# Patient Record
Sex: Male | Born: 1945 | Race: Black or African American | Hispanic: No | Smoking: Former smoker
Health system: Southern US, Community
[De-identification: ages and names within clinical notes are randomized; demographics above are authoritative.]

## PROBLEM LIST (undated history)

## (undated) DIAGNOSIS — I10 Essential (primary) hypertension: Secondary | ICD-10-CM

## (undated) DIAGNOSIS — C140 Malignant neoplasm of pharynx, unspecified: Secondary | ICD-10-CM

## (undated) DIAGNOSIS — C7800 Secondary malignant neoplasm of unspecified lung: Secondary | ICD-10-CM

## (undated) DIAGNOSIS — E785 Hyperlipidemia, unspecified: Secondary | ICD-10-CM

## (undated) DIAGNOSIS — E119 Type 2 diabetes mellitus without complications: Secondary | ICD-10-CM

## (undated) HISTORY — PX: COLONOSCOPY: SHX174

---

## 2011-08-11 HISTORY — PX: THROAT SURGERY: SHX803

## 2013-10-12 ENCOUNTER — Encounter (HOSPITAL_COMMUNITY): Payer: Self-pay | Admitting: Emergency Medicine

## 2013-10-12 ENCOUNTER — Inpatient Hospital Stay (HOSPITAL_COMMUNITY)
Admission: EM | Admit: 2013-10-12 | Discharge: 2013-10-14 | DRG: 378 | Disposition: A | Discharge status: Home / Self Care | Payer: Medicare HMO | Attending: Family Medicine | Admitting: Family Medicine

## 2013-10-12 DIAGNOSIS — D696 Thrombocytopenia, unspecified: Secondary | ICD-10-CM | POA: Diagnosis present

## 2013-10-12 DIAGNOSIS — E7800 Pure hypercholesterolemia, unspecified: Secondary | ICD-10-CM | POA: Diagnosis present

## 2013-10-12 DIAGNOSIS — K5900 Constipation, unspecified: Secondary | ICD-10-CM | POA: Diagnosis present

## 2013-10-12 DIAGNOSIS — K2970 Gastritis, unspecified, without bleeding: Secondary | ICD-10-CM | POA: Diagnosis present

## 2013-10-12 DIAGNOSIS — Z87891 Personal history of nicotine dependence: Secondary | ICD-10-CM

## 2013-10-12 DIAGNOSIS — E119 Type 2 diabetes mellitus without complications: Secondary | ICD-10-CM | POA: Diagnosis present

## 2013-10-12 DIAGNOSIS — Z923 Personal history of irradiation: Secondary | ICD-10-CM

## 2013-10-12 DIAGNOSIS — Z7982 Long term (current) use of aspirin: Secondary | ICD-10-CM

## 2013-10-12 DIAGNOSIS — K2990 Gastroduodenitis, unspecified, without bleeding: Secondary | ICD-10-CM

## 2013-10-12 DIAGNOSIS — D62 Acute posthemorrhagic anemia: Secondary | ICD-10-CM | POA: Diagnosis present

## 2013-10-12 DIAGNOSIS — C140 Malignant neoplasm of pharynx, unspecified: Secondary | ICD-10-CM | POA: Diagnosis present

## 2013-10-12 DIAGNOSIS — R061 Stridor: Secondary | ICD-10-CM | POA: Diagnosis not present

## 2013-10-12 DIAGNOSIS — K264 Chronic or unspecified duodenal ulcer with hemorrhage: Principal | ICD-10-CM | POA: Diagnosis present

## 2013-10-12 DIAGNOSIS — I10 Essential (primary) hypertension: Secondary | ICD-10-CM | POA: Diagnosis present

## 2013-10-12 DIAGNOSIS — K922 Gastrointestinal hemorrhage, unspecified: Secondary | ICD-10-CM

## 2013-10-12 DIAGNOSIS — D649 Anemia, unspecified: Secondary | ICD-10-CM

## 2013-10-12 DIAGNOSIS — E785 Hyperlipidemia, unspecified: Secondary | ICD-10-CM | POA: Diagnosis present

## 2013-10-12 HISTORY — DX: Malignant neoplasm of pharynx, unspecified: C14.0

## 2013-10-12 HISTORY — DX: Essential (primary) hypertension: I10

## 2013-10-12 HISTORY — DX: Type 2 diabetes mellitus without complications: E11.9

## 2013-10-12 HISTORY — DX: Hyperlipidemia, unspecified: E78.5

## 2013-10-12 LAB — COMPREHENSIVE METABOLIC PANEL
ALBUMIN: 3.3 g/dL — AB (ref 3.5–5.2)
ALT: 16 U/L (ref 0–53)
AST: 22 U/L (ref 0–37)
Alkaline Phosphatase: 53 U/L (ref 39–117)
BILIRUBIN TOTAL: 0.2 mg/dL — AB (ref 0.3–1.2)
BUN: 21 mg/dL (ref 6–23)
CO2: 33 meq/L — AB (ref 19–32)
CREATININE: 1.25 mg/dL (ref 0.50–1.35)
Calcium: 9.4 mg/dL (ref 8.4–10.5)
Chloride: 97 mEq/L (ref 96–112)
GFR calc Af Amer: 67 mL/min — ABNORMAL LOW (ref >90)
GFR calc non Af Amer: 58 mL/min — ABNORMAL LOW (ref >90)
Glucose, Bld: 140 mg/dL — ABNORMAL HIGH (ref 70–99)
POTASSIUM: 4.6 meq/L (ref 3.7–5.3)
Sodium: 139 mEq/L (ref 137–147)
Total Protein: 6.7 g/dL (ref 6.0–8.3)

## 2013-10-12 LAB — CBC WITH DIFFERENTIAL/PLATELET
BASOS ABS: 0 10*3/uL (ref 0.0–0.1)
Basophils Relative: 0 % (ref 0–1)
Eosinophils Absolute: 0 10*3/uL (ref 0.0–0.7)
Eosinophils Relative: 0 % (ref 0–5)
HCT: 17 % — ABNORMAL LOW (ref 39.0–52.0)
Hemoglobin: 5.7 g/dL — CL (ref 13.0–17.0)
Lymphocytes Relative: 9 % — ABNORMAL LOW (ref 12–46)
Lymphs Abs: 0.5 10*3/uL — ABNORMAL LOW (ref 0.7–4.0)
MCH: 27.5 pg (ref 26.0–34.0)
MCHC: 33.5 g/dL (ref 30.0–36.0)
MCV: 82.1 fL (ref 78.0–100.0)
MONO ABS: 0.4 10*3/uL (ref 0.1–1.0)
Monocytes Relative: 7 % (ref 3–12)
Neutro Abs: 4.5 10*3/uL (ref 1.7–7.7)
Neutrophils Relative %: 84 % — ABNORMAL HIGH (ref 43–77)
PLATELETS: 180 10*3/uL (ref 150–400)
RBC: 2.07 MIL/uL — ABNORMAL LOW (ref 4.22–5.81)
RDW: 15.8 % — ABNORMAL HIGH (ref 11.5–15.5)
WBC: 5.4 10*3/uL (ref 4.0–10.5)

## 2013-10-12 LAB — PROTIME-INR
INR: 1.07 (ref 0.00–1.49)
Prothrombin Time: 13.7 seconds (ref 11.6–15.2)

## 2013-10-12 LAB — CBG MONITORING, ED: GLUCOSE-CAPILLARY: 95 mg/dL (ref 70–99)

## 2013-10-12 LAB — RETICULOCYTES
RBC.: 2.07 MIL/uL — ABNORMAL LOW (ref 4.22–5.81)
RETIC CT PCT: 5.8 % — AB (ref 0.4–3.1)
Retic Count, Absolute: 120.1 10*3/uL (ref 19.0–186.0)

## 2013-10-12 LAB — PREPARE RBC (CROSSMATCH)

## 2013-10-12 LAB — ABO/RH: ABO/RH(D): O POS

## 2013-10-12 LAB — APTT: aPTT: 28 seconds (ref 24–37)

## 2013-10-12 MED ORDER — SIMVASTATIN 20 MG PO TABS
40.0000 mg | ORAL_TABLET | Freq: Every day | ORAL | Status: DC
  Administered 2013-10-13: 40 mg via ORAL
  Filled 2013-10-12: qty 1
  Filled 2013-10-12: qty 2
  Filled 2013-10-12: qty 1

## 2013-10-12 MED ORDER — METFORMIN HCL 500 MG PO TABS: 500.0000 mg | ORAL_TABLET | Freq: Every day | ORAL | Status: DC

## 2013-10-12 MED ORDER — METFORMIN HCL 500 MG PO TABS
500.0000 mg | ORAL_TABLET | Freq: Every day | ORAL | Status: DC
  Administered 2013-10-13: 500 mg via ORAL
  Filled 2013-10-12: qty 1

## 2013-10-12 MED ORDER — ONDANSETRON HCL 4 MG PO TABS: 4.0000 mg | ORAL_TABLET | Freq: Four times a day (QID) | ORAL | Status: DC | PRN

## 2013-10-12 MED ORDER — SODIUM CHLORIDE 0.9 % IV SOLN
INTRAVENOUS | Status: DC
  Administered 2013-10-13 (×2): via INTRAVENOUS

## 2013-10-12 MED ORDER — PANTOPRAZOLE SODIUM 40 MG IV SOLR
40.0000 mg | INTRAVENOUS | Status: DC
  Administered 2013-10-12: 40 mg via INTRAVENOUS
  Filled 2013-10-12: qty 40

## 2013-10-12 MED ORDER — INSULIN ASPART 100 UNIT/ML ~~LOC~~ SOLN: 0.0000 [IU] | Freq: Three times a day (TID) | SUBCUTANEOUS | Status: DC

## 2013-10-12 MED ORDER — ONDANSETRON HCL 4 MG/2ML IJ SOLN: 4.0000 mg | Freq: Four times a day (QID) | INTRAMUSCULAR | Status: DC | PRN

## 2013-10-12 NOTE — ED Notes (Signed)
CRITICAL VALUE ALERT  Critical value received:  Hgb 5.7 Hct 17.0  Date of notification:  10/12/13  Time of notification:  1920  Critical value read back:yes  Nurse who received alert:  B. Olena Heckle, RN  MD notified (1st page):  Alvino Chapel  Time of first page:  1920  MD notified (2nd page):  Time of second page:  Responding MD:  Alvino Chapel  Time MD responded:  [phone]

## 2013-10-12 NOTE — ED Notes (Addendum)
Had routine checkup today at the [place] - reports was told to come to ED this afternoon for hgb 5.5.   Denies bleeding.  Reports "may have seen some rectal bleeding last night."  Also reports dizziness.

## 2013-10-12 NOTE — H&P (Signed)
[facility] History and Physical  MONTGOMERY FAVOR GEX:[id number] DOB: September 02, 1945 DOA: 10/12/2013  Referring physician: Dr. Alvino Chapel, ER.   Chief Complaint: Dizziness, lightheadedness, fatigue.  HPI: Joseph Petersen is a 68 y.o. male  This man, patient of the [place] in the arm, presents with two-week history of dizziness, lightheadedness and extreme fatigue. He also describes rectal bleeding yesterday. There is no hematemesis. His abdomen feels somewhat uncomfortable but there is no significant pain. When he was evaluated in the emergency room, he was found to have significant anemia of 5.7. This man has also been diagnosed with throat cancer in 2013 for which he has had local radiotherapy and lymph node dissection. He is due to have further surgery on his throat soon in the [place] system. He describes a weight loss of 15 pounds in the last couple of months.   Review of Systems:  Constitutional:  No night sweats, Fevers, chills. He does describe fatigue as mentioned above. HEENT:  No headaches, Difficulty swallowing,Tooth/dental problems,Sore throat,  No sneezing, itching, ear ache, nasal congestion, post nasal drip,  Cardio-vascular:  No chest pain, Orthopnea, PND, swelling in lower extremities, anasarca, dizziness, palpitations  GI:  As mentioned above, anorexia with weight loss and abdominal discomfort. Resp:  No shortness of breath with exertion or at rest. No excess mucus, no productive cough, No non-productive cough, No coughing up of blood.No change in color of mucus.No wheezing.No chest wall deformity  Skin:  no rash or lesions.  GU:  no dysuria, change in color of urine, no urgency or frequency. No flank pain.  Musculoskeletal:  No joint pain or swelling. No decreased range of motion. No back pain.  Psych:  No change in mood or affect. No depression or anxiety. No memory loss.   Past Medical History  Diagnosis Date  . Diabetes mellitus without complication   .  Hypertension   . High cholesterol   . Cancer     throat  . Diabetes 10/12/2013  . Throat cancer 10/12/2013  . Hyperlipidemia 10/12/2013   Past Surgical History  Procedure Laterality Date  . Throat surgery      remove cancer   Social History:  reports that he has quit smoking. He does not have any smokeless tobacco history on file. He reports that he does not drink alcohol or use illicit drugs.  No Known Allergies  No family history on file.   Prior to Admission medications   Medication Sig Start Date End Date Taking? Authorizing Provider  aspirin EC 81 MG tablet Take 81 mg by mouth daily.   Yes Historical Provider, MD  metFORMIN (GLUCOPHAGE) 500 MG tablet Take 500 mg by mouth daily.   Yes Historical Provider, MD  simvastatin (ZOCOR) 40 MG tablet Take 40 mg by mouth daily.   Yes Historical Provider, MD   Physical Exam: Filed Vitals:   10/12/13 1923  BP:   Pulse: 99  Temp:   Resp: 18    BP 133/75  Pulse 99  Temp(Src) 98.1 F (36.7 C) (Oral)  Resp 18  Ht 6' 4.5" (1.943 m)  Wt 74.844 kg (165 lb)  BMI 19.82 kg/m2  SpO2 100%  General:  Appears calm and comfortable. Clinically pale.  Eyes: PERRL, normal lids, irises & conjunctiva ENT: grossly normal hearing, lips & tongue Neck: The neck feels firm on the left side where he has undergone radiotherapy and lymph node dissection. Cardiovascular: RRR, no m/r/g. No LE edema. Telemetry: SR, no arrhythmias  Respiratory: CTA bilaterally, no  w/r/r. Normal respiratory effort. Abdomen: soft, ntnd Skin: no rash or induration seen on limited exam Musculoskeletal: grossly normal tone BUE/BLE Psychiatric: grossly normal mood and affect, speech fluent and appropriate Neurologic: grossly non-focal.          Labs on Admission:  Basic Metabolic Panel:  Recent Labs Lab 10/12/13 1900  NA 139  K 4.6  CL 97  CO2 33*  GLUCOSE 140*  BUN 21  CREATININE 1.25  CALCIUM 9.4   Liver Function Tests:  Recent Labs Lab 10/12/13 1900    AST 22  ALT 16  ALKPHOS 53  BILITOT 0.2*  PROT 6.7  ALBUMIN 3.3*     CBC:  Recent Labs Lab 10/12/13 1900  WBC 5.4  NEUTROABS 4.5  HGB 5.7*  HCT 17.0*  MCV 82.1  PLT 180      Radiological Exams on Admission: No results found.   Assessment/Plan   1. Symptomatic significant anemia, probably acute blood loss anemia combined with anemia of chronic disease. Hemoglobin is 5.7. Use of low-dose daily aspirin. No other nonsteroidal anti-inflammatory medications. No history of alcohol abuse. 2. Throat cancer diagnosed in 2013 for which she has undergone radiotherapy and lymph node dissection. He is due to have more surgery on his throat soon in the [place] system. 3. Type 2 diabetes mellitus. 4. Hyperlipidemia on statin medication.  Plan: 1. Admit to medical floor. 2. Transfuse 2 units of blood. Repeat hemoglobin after the first 2 units. He will probably require 4 units at least. Discontinue aspirin. 3. Gastroenterology consultation. 4. Sliding scale of insulin for diabetic control.  Other recommendations will depend on patient's hospital progress.   Code Status: Full code.  Family Communication: Discussed plan with patient at the bedside.   Disposition Plan: Home when medically stable.   Time spent: 45 minutes.  Doree Albee [facility]

## 2013-10-12 NOTE — ED Notes (Signed)
Rate changed to 155mL/hr

## 2013-10-12 NOTE — ED Notes (Signed)
See EDP's assessment for further, EDP in to see pt prior to RN

## 2013-10-13 ENCOUNTER — Encounter (HOSPITAL_COMMUNITY)
Admission: EM | Disposition: A | Discharge status: Home / Self Care | Payer: Self-pay | Source: Home / Self Care | Attending: Family Medicine

## 2013-10-13 ENCOUNTER — Encounter (HOSPITAL_COMMUNITY): Payer: Self-pay | Admitting: Gastroenterology

## 2013-10-13 DIAGNOSIS — D62 Acute posthemorrhagic anemia: Secondary | ICD-10-CM

## 2013-10-13 DIAGNOSIS — K264 Chronic or unspecified duodenal ulcer with hemorrhage: Secondary | ICD-10-CM

## 2013-10-13 DIAGNOSIS — K922 Gastrointestinal hemorrhage, unspecified: Secondary | ICD-10-CM

## 2013-10-13 DIAGNOSIS — K259 Gastric ulcer, unspecified as acute or chronic, without hemorrhage or perforation: Secondary | ICD-10-CM

## 2013-10-13 DIAGNOSIS — D649 Anemia, unspecified: Secondary | ICD-10-CM

## 2013-10-13 DIAGNOSIS — K222 Esophageal obstruction: Secondary | ICD-10-CM

## 2013-10-13 HISTORY — PX: ESOPHAGOGASTRODUODENOSCOPY: SHX5428

## 2013-10-13 LAB — GLUCOSE, CAPILLARY
GLUCOSE-CAPILLARY: 95 mg/dL (ref 70–99)
GLUCOSE-CAPILLARY: 103 mg/dL — AB (ref 70–99)
GLUCOSE-CAPILLARY: 113 mg/dL — AB (ref 70–99)
Glucose-Capillary: 82 mg/dL (ref 70–99)
Glucose-Capillary: 91 mg/dL (ref 70–99)

## 2013-10-13 LAB — COMPREHENSIVE METABOLIC PANEL
ALK PHOS: 40 U/L (ref 39–117)
ALT: 14 U/L (ref 0–53)
AST: 16 U/L (ref 0–37)
Albumin: 3 g/dL — ABNORMAL LOW (ref 3.5–5.2)
BUN: 14 mg/dL (ref 6–23)
CHLORIDE: 100 meq/L (ref 96–112)
CO2: 30 meq/L (ref 19–32)
Calcium: 9.2 mg/dL (ref 8.4–10.5)
Creatinine, Ser: 1.03 mg/dL (ref 0.50–1.35)
GFR calc non Af Amer: 73 mL/min — ABNORMAL LOW (ref >90)
GFR, EST AFRICAN AMERICAN: 85 mL/min — AB (ref >90)
GLUCOSE: 92 mg/dL (ref 70–99)
Potassium: 4.2 mEq/L (ref 3.7–5.3)
Sodium: 138 mEq/L (ref 137–147)
Total Bilirubin: 0.5 mg/dL (ref 0.3–1.2)
Total Protein: 6.3 g/dL (ref 6.0–8.3)

## 2013-10-13 LAB — CBC
HCT: 23.9 % — ABNORMAL LOW (ref 39.0–52.0)
Hemoglobin: 8.1 g/dL — ABNORMAL LOW (ref 13.0–17.0)
MCH: 28.4 pg (ref 26.0–34.0)
MCHC: 33.9 g/dL (ref 30.0–36.0)
MCV: 83.9 fL (ref 78.0–100.0)
Platelets: 130 10*3/uL — ABNORMAL LOW (ref 150–400)
RBC: 2.85 MIL/uL — ABNORMAL LOW (ref 4.22–5.81)
RDW: 15.6 % — AB (ref 11.5–15.5)
WBC: 4.2 10*3/uL (ref 4.0–10.5)

## 2013-10-13 LAB — IRON AND TIBC
Iron: 40 ug/dL — ABNORMAL LOW (ref 42–135)
SATURATION RATIOS: 15 % — AB (ref 20–55)
TIBC: 273 ug/dL (ref 215–435)
UIBC: 233 ug/dL (ref 125–400)

## 2013-10-13 LAB — FERRITIN: Ferritin: 204 ng/mL (ref 22–322)

## 2013-10-13 LAB — FOLATE: Folate: 13.2 ng/mL

## 2013-10-13 LAB — VITAMIN B12: Vitamin B-12: 341 pg/mL (ref 211–911)

## 2013-10-13 SURGERY — EGD (ESOPHAGOGASTRODUODENOSCOPY)
Anesthesia: Moderate Sedation

## 2013-10-13 MED ORDER — MEPERIDINE HCL 100 MG/ML IJ SOLN
INTRAMUSCULAR | Status: DC | PRN
  Administered 2013-10-13 (×3): 25 mg via INTRAVENOUS

## 2013-10-13 MED ORDER — PANTOPRAZOLE SODIUM 40 MG IV SOLR
40.0000 mg | Freq: Every day | INTRAVENOUS | Status: DC
  Administered 2013-10-13: 40 mg via INTRAVENOUS
  Filled 2013-10-13: qty 40

## 2013-10-13 MED ORDER — NALOXONE HCL 0.4 MG/ML IJ SOLN
INTRAMUSCULAR | Status: AC
  Administered 2013-10-13: 0.4 mg
  Filled 2013-10-13: qty 1

## 2013-10-13 MED ORDER — RACEPINEPHRINE HCL 2.25 % IN NEBU
0.5000 mL | INHALATION_SOLUTION | Freq: Once | RESPIRATORY_TRACT | Status: AC
  Administered 2013-10-13: 0.5 mL via RESPIRATORY_TRACT

## 2013-10-13 MED ORDER — LIDOCAINE VISCOUS 2 % MT SOLN
OROMUCOSAL | Status: AC
  Filled 2013-10-13: qty 15

## 2013-10-13 MED ORDER — FLUMAZENIL 0.5 MG/5ML IV SOLN
INTRAVENOUS | Status: AC
  Administered 2013-10-13: 14:00:00
  Filled 2013-10-13: qty 5

## 2013-10-13 MED ORDER — SODIUM CHLORIDE 0.9 % IV SOLN: INTRAVENOUS | Status: DC

## 2013-10-13 MED ORDER — MEPERIDINE HCL 100 MG/ML IJ SOLN
INTRAMUSCULAR | Status: AC
  Filled 2013-10-13: qty 2

## 2013-10-13 MED ORDER — MIDAZOLAM HCL 5 MG/5ML IJ SOLN
INTRAMUSCULAR | Status: DC | PRN
  Administered 2013-10-13: 1 mg via INTRAVENOUS
  Administered 2013-10-13: 2 mg via INTRAVENOUS
  Administered 2013-10-13: 1 mg via INTRAVENOUS
  Administered 2013-10-13: 2 mg via INTRAVENOUS

## 2013-10-13 MED ORDER — SODIUM CHLORIDE 0.9 % IN NEBU
INHALATION_SOLUTION | RESPIRATORY_TRACT | Status: AC
Start: 2013-10-13 — End: 2013-10-13
  Filled 2013-10-13: qty 3

## 2013-10-13 MED ORDER — FLUMAZENIL 0.5 MG/5ML IV SOLN: 0.5000 mg | Freq: Once | INTRAVENOUS | Status: DC

## 2013-10-13 MED ORDER — PANTOPRAZOLE SODIUM 40 MG PO TBEC
40.0000 mg | DELAYED_RELEASE_TABLET | Freq: Two times a day (BID) | ORAL | Status: DC
  Administered 2013-10-13 – 2013-10-14 (×2): 40 mg via ORAL
  Filled 2013-10-13 (×2): qty 1

## 2013-10-13 MED ORDER — MIDAZOLAM HCL 5 MG/5ML IJ SOLN
INTRAMUSCULAR | Status: AC
  Filled 2013-10-13: qty 10

## 2013-10-13 MED ORDER — STERILE WATER FOR IRRIGATION IR SOLN
Status: DC | PRN
  Administered 2013-10-13: 13:00:00

## 2013-10-13 MED ORDER — NALOXONE HCL 0.4 MG/ML IJ SOLN: 0.4000 mg | Freq: Once | INTRAMUSCULAR | Status: DC

## 2013-10-13 MED ORDER — ENSURE COMPLETE PO LIQD
237.0000 mL | Freq: Two times a day (BID) | ORAL | Status: DC
  Administered 2013-10-14: 237 mL via ORAL

## 2013-10-13 MED ORDER — LIDOCAINE VISCOUS 2 % MT SOLN
OROMUCOSAL | Status: DC | PRN
  Administered 2013-10-13: 1 via OROMUCOSAL

## 2013-10-13 MED ORDER — NALOXONE HCL 0.4 MG/ML IJ SOLN
INTRAMUSCULAR | Status: AC
  Filled 2013-10-13: qty 1

## 2013-10-13 MED ORDER — ATROPINE SULFATE 1 MG/ML IJ SOLN
INTRAMUSCULAR | Status: AC
Start: 2013-10-13 — End: 2013-10-14
  Filled 2013-10-13: qty 1

## 2013-10-13 NOTE — Progress Notes (Signed)
Received pt from Endo for further monitoring due to not responsive to painful stimuli with audible stridor. No lung sounds auscultated.  No Respirations noted, assisted with ambu bag, jaw-thrust, chin lift. MD notified. Code called and MD arrived to bedside within a minute. Romazicon, Narcan, and Racemic epi neb given per order. Pt stabilized with spontaneous respirations. Responsive to verbal stimulus.

## 2013-10-13 NOTE — H&P (Signed)
  Primary Care Physician:  No primary provider on file. Primary Gastroenterologist:  Dr. Oneida Alar  Pre-Procedure History & Physical: HPI:  Joseph Petersen is a 68 y.o. male here for MELENA/brbpr on ASA.  Past Medical History  Diagnosis Date  . Hypertension   . Diabetes 10/12/2013  . Throat cancer 10/12/2013  . Hyperlipidemia 10/12/2013    Past Surgical History  Procedure Laterality Date  . Throat surgery  2013    remove cancer, [facility] [place]  . Colonoscopy      [facility] around 2007? polyps    Prior to Admission medications   Medication Sig Start Date End Date Taking? Authorizing Provider  aspirin EC 81 MG tablet Take 81 mg by mouth daily.   Yes Historical Provider, MD  metFORMIN (GLUCOPHAGE) 500 MG tablet Take 500 mg by mouth daily.   Yes Historical Provider, MD  simvastatin (ZOCOR) 40 MG tablet Take 40 mg by mouth daily.   Yes Historical Provider, MD    Allergies as of 10/12/2013  . (No Known Allergies)    Family History  Problem Relation Age of Onset  . Colon cancer Neg Hx     History   Social History  . Marital Status: Legally Separated    Spouse Name: N/A    Number of Children: 3  . Years of Education: N/A   Occupational History  . Not on file.   Social History Main Topics  . Smoking status: Former Research scientist (life sciences)  . Smokeless tobacco: Not on file  . Alcohol Use: No  . Drug Use: No  . Sexual Activity: Not on file   Other Topics Concern  . Not on file   Social History Narrative  . No narrative on file    Review of Systems: See HPI, otherwise negative ROS   Physical Exam: BP 132/65  Pulse 84  Temp(Src) 98.1 F (36.7 C) (Oral)  Resp 18  Ht 6\' 4"  (1.93 m)  Wt 166 lb 6.4 oz (75.479 kg)  BMI 20.26 kg/m2  SpO2 100% General:   Alert,  pleasant and cooperative in NAD Head:  Normocephalic and atraumatic. Neck:  Supple; Lungs:  Clear throughout to auscultation.    Heart:  Regular rate and rhythm. Abdomen:  Soft, nontender and nondistended. Normal bowel sounds,  without guarding, and without rebound.   Neurologic:  Alert and  oriented x4;  grossly normal neurologically.  Impression/Plan:    MELENA/brbpr  PLAN: 1. EGD TODAY

## 2013-10-13 NOTE — Plan of Care (Signed)
Pt s/p reversal on conscious sedation. MENTAL STATUS NL. TOLERATING FULL LIQUID DIET. ADVANCE TO SOFT MECHANICAL DIET.

## 2013-10-13 NOTE — Progress Notes (Signed)
PROGRESS NOTE  Joseph Petersen:[id number] DOB: October 25, 1945 DOA: 10/12/2013 PCP: [facility] provider [facility] file[facility]  Summary: 68 year old man presented with history of dizziness, lightheadedness, fatigue, rectal bleeding. Hemoglobin found to be 5.7. Admitted for further evaluation of GI bleed.  Assessment/Plan: 1. Upper GI bleed secondary to duodenal ulcer. Status post endoscopy 3/6. No evidence of recurrent bleeding. Aspirin discontinued. 2. Acute blood loss anemia. Secondary to above. Hemoglobin improved status post 2 units packed red blood cells. 3. Stridor, resolved in PACU. Patient appears quite stable at this point.  4. Diabetes mellitus. Capillary blood sugars stable. Resume metformin on discharge. Sliding-scale insulin. 5. Throat cancer diagnosed 2013 through with local radiotherapy and lymph node dissection. Further surgery is planned soon at [place].   PPI twice a day, full liquid diet.  No aspirin or NSAIDs for 4 weeks. No anticoagulation for 2 weeks.  CBC in the morning.  Likely home 3/7.  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  Transfusion 2 units packed red blood cells 3/5  HPI/Subjective: Feels well. No complaints. Breathing fine. Wants to go home.  Earlier today, Code Blue was called in PACU post-EGD. Case discussed with staff and Dr. Oneida Alar. Patient suffered neither cardiac nor respiratory arrest, however he became unresponsive to tactile stimulation and was noticed to have some stridor. This rapidly improved with racemic epinephrine and supportive treatment. I  examined him in PACU where he was quite stable.  Objective: Filed Vitals:   10/13/13 1450 10/13/13 1455 10/13/13 1515 10/13/13 1530  BP:   128/68 131/71  Pulse: 90 90 96 93  Temp:      TempSrc:      Resp: 14 14 17 18   Height:      Weight:      SpO2: 100% 100% 100% 98%    Intake/Output Summary (Last 24 hours) at 10/13/13 1653 Last data filed at 10/13/13 1038  Gross per 24 hour  Intake    350 ml  Output    600 ml  Net   -250 ml     Filed Weights   10/12/13 1838 10/12/13 2330  Weight: 74.844 kg (165 lb) 75.479 kg (166 lb 6.4 oz)    Exam:   Afebrile. Vital signs stable.  Gen. Appears calm and comfortable, speech fluent and clear.  Cardiovascular regular rate and rhythm. No murmur, rub or gallop. No lower extremity edema.  Respiratory clear to auscultation bilaterally. No wheezes, rales or rhonchi. Normal respiratory effort.  Psychiatric grossly normal mood and affect. Speech fluent and appropriate.  Data Reviewed:  Capillary blood sugars stable.  Complete metabolic panel unremarkable.  Hemoglobin 5.7 >> 8.1 status post transfusion.  Platelet count 130 likely related to blood loss.  Scheduled Meds: . atropine      . [START ON 10/14/2013] feeding supplement (ENSURE COMPLETE)  237 mL Oral BID BM  . flumazenil  0.5 mg Intravenous Once  . insulin aspart  0-9 Units Subcutaneous TID WC  . lidocaine      . meperidine      . metFORMIN  500 mg Oral Q breakfast  . midazolam      . naloxone  0.4 mg  Intravenous Once  . pantoprazole  40 mg Oral BID AC  . simvastatin  40 mg Oral q1800   Continuous Infusions: . sodium chloride 100 mL/hr at 10/13/13 1240  . sodium chloride 20 mL/hr at 10/13/13 1600    Principal Problem:   Duodenal ulcer with hemorrhage Active Problems:   Anemia   Throat cancer   Diabetes   Hyperlipidemia   Acute blood loss anemia   UGIB (upper gastrointestinal bleed)   Time spent 20 minutes

## 2013-10-13 NOTE — Progress Notes (Signed)
INITIAL NUTRITION ASSESSMENT  DOCUMENTATION CODES Per approved criteria  -Not Applicable   INTERVENTION: Follow for diet advancement Ensure Complete po BID, each supplement provides 350 kcal and 13 grams of protein with diet advancement  NUTRITION DIAGNOSIS: Inadequate oral intakerelated to decreased appetite as evidenced by hx wt loss, throat cancer.   Goal: Pt will meet >90% of estimated nutritional needs  Monitor:  Diet advancement, PO intake, labs, weight changes, skin assessments, I/O's  Reason for Assessment: MST=3  68 y.o. male  Admitting Dx: <principal problem not specified>  ASSESSMENT: Pt admitted for anemia. GI consult pending. Pt currently NPO for EGD today.  Pt with hx of throat cancer s/p radiation and lymph node dissection. Per H&P, pt awaiting further throat surgery at [facility].  Per RN notes, pt reports 15# (8.2%) over the past few months, which is clinically significant. No PO data available at this time, although suspect inadequate intake given wt loss and cancer.  Pt not available for interview and exam after multiple visits.  Pt does not meet criteria for malnutrition at this time, however, is at high risk for malnutrition, given wt loss and increased nutritional needs for cancer. Pt would benefit from nutritional supplement for additional support, will add upon diet advancement.   Height: Ht Readings from Last 1 Encounters:  10/12/13 6\' 4"  (1.93 m)    Weight: Wt Readings from Last 1 Encounters:  10/12/13 166 lb 6.4 oz (75.479 kg)    Ideal Body Weight: 202#  % Ideal Body Weight: 82%  Wt Readings from Last 10 Encounters:  10/12/13 166 lb 6.4 oz (75.479 kg)  10/12/13 166 lb 6.4 oz (75.479 kg)    Usual Body Weight: 181#  % Usual Body Weight: 92%  BMI:  Body mass index is 20.26 kg/(m^2). Meets criteria for normal weight.   Estimated Nutritional Needs: Kcal: [phone] daily Protein: 75-94 grams daily Fluid: 2.3-2.6 L daily  Skin: WDL  Diet  Order: NPO  EDUCATION NEEDS: -Education not appropriate at this time   Intake/Output Summary (Last 24 hours) at 10/13/13 1406 Last data filed at 10/13/13 1038  Gross per 24 hour  Intake    350 ml  Output    600 ml  Net   -250 ml    Last BM: 10/11/13  Labs:   Recent Labs Lab 10/12/13 1900 10/13/13 0624  NA 139 138  K 4.6 4.2  CL 97 100  CO2 33* 30  BUN 21 14  CREATININE 1.25 1.03  CALCIUM 9.4 9.2  GLUCOSE 140* 92    CBG (last 3)   Recent Labs  10/13/13 0732 10/13/13 1132 10/13/13 1239  GLUCAP 82 95 103*    Scheduled Meds: . atropine      . [MAR HOLD] insulin aspart  0-9 Units Subcutaneous TID WC  . lidocaine      . meperidine      . [facility] HOLD] metFORMIN  500 mg Oral Q breakfast  . midazolam      . [MAR HOLD] pantoprazole (PROTONIX) IV  40 mg Intravenous QAC breakfast  . [MAR HOLD] simvastatin  40 mg Oral q1800    Continuous Infusions: . sodium chloride 100 mL/hr at 10/13/13 1240    Past Medical History  Diagnosis Date  . Hypertension   . Diabetes 10/12/2013  . Throat cancer 10/12/2013  . Hyperlipidemia 10/12/2013    Past Surgical History  Procedure Laterality Date  . Throat surgery  2013    remove cancer, [facility] [place]  . Colonoscopy  [place] around 2007? polyps    Joseph Petersen A. Jimmye Norman, RD, LDN Pager: [phone]

## 2013-10-13 NOTE — OR Nursing (Signed)
Patient had an EGD. At the end of procedure, increased stridor noted, O2 saturation 100%, on 3 liters via nasal cannula. Vital Signs WNL. Unable to arouse patient after calling patients name,doing sternal rub, repositioning, and elevating the HOB. Dr. Oneida Alar called to room to evaluate patient and stated patient was at baseline before procedure. Patient transferred to PACU and upon auscultation of lungs by Charm Barges RN and [facility] RN, no breath sounds were noted. Patient bagged with ambu bag and O2 saturation remained 100%. Patient was given narcan and romazicon per Dr. Oneida Alar order and patient responded appropriately. Patient will continue to be monitored in PACU.

## 2013-10-13 NOTE — Care Management Note (Signed)
UR completed 

## 2013-10-13 NOTE — Op Note (Addendum)
[facility] [address] [place], [address]   ENDOSCOPY PROCEDURE REPORT  PATIENT: Joseph Petersen, Joseph Petersen  MR#: [id number] BIRTHDATE: 1946-01-15 , 67  yrs. old GENDER: Male  ENDOSCOPIST: Barney Drain, MD REFERRED:     [place], Hecker Gilliam  PROCEDURE DATE: 10/13/2013 PROCEDURE:   EGD w/ biopsy  INDICATIONS:Melena. MEDICATIONS: Demerol 75 mg IV and Versed 6 mg IV TOPICAL ANESTHETIC:   Viscous Xylocaine  DESCRIPTION OF PROCEDURE:     Physical exam was performed.  Informed consent was obtained from the patient after explaining the benefits, risks, and alternatives to the procedure.  The patient was connected to the monitor and placed in the left lateral position.  Continuous oxygen was provided by nasal cannula and IV medicine administered through an indwelling cannula.  After administration of sedation, the patients esophagus was intubated and the EG-[id number] ([id number])  endoscope was advanced under direct visualization to the second portion of the duodenum.  The scope was removed slowly by carefully examining the color, texture, anatomy, and integrity of the mucosa on the way out.  The patient was recovered in endoscopy and discharged TO FLOOR IN satisfactory condition.   ESOPHAGUS: A stricture was found at the gastroesophageal junction. The stenosis was traversable with the endoscope.   STOMACH: Mild non-erosive gastritis (inflammation) was found in the gastric antrum.  Multiple biopsies were performed using cold forceps. DUODENUM: A medium sized punctate and deep ulcer with surrounding edema was found in the duodenal bulb.   The duodenal mucosa showed no abnormalities in the 2nd part of the duodenum. COMPLICATIONS:   PT HAD STRIDOR IN PACU. CODE BLUE CALLED IN SETTING OF O2 SAT 100% AND SBP 152/HR 100. NARCAN 0.4 MG IV/ROMAZICON 0.5 MG IV/RACEMIC EPI NEBx1 GIVEN.  ENDOSCOPIC IMPRESSION: 1.   Stricture  at the gastroesophageal junction 2.   MILD Non-erosive  gastritis 3.   GI BLEED DUE TO Medium sized ulcer in the duodenal bulb  RECOMMENDATIONS: BID PPI FULL LIQUID DIET(FLD)-IF TOLERATES FLD, ADVANCE TO SOFT MECHNICAL DIET. AWAIT BIOPSY NO ASA/NSAIDs FOR 4 WEEKS. NO ANTICOAGULATION FOR 2 WEEKS.   REPEAT EXAM:   _______________________________ Lorrin MaisBarney Drain, MD 10/14/2013 10:51 AM Revised: 10/14/2013 10:51 AM

## 2013-10-13 NOTE — Consult Note (Signed)
Referring Provider: Doree Albee, MD Primary Care Physician:  No primary provider on file. Primary Gastroenterologist:  Barney Drain, MD   Reason for Consultation:  GI bleed, profound anemia  HPI: Joseph Petersen is a 68 y.o. male presents with lightheadedness, fatigue, rectal bleeding. H/O throat cancer in 2013 with surgery, XRT, LN dissection. Due for additional surgery at [facility] soon.   Rectal bleeding and melena about three days ago X 1. No abdominal pain. Poor appetite. No significant dysphagia. No h/o PEG with cancer treatments. Sometimes has heartburn. More constipation lately. Went to [place] yesterday and when blood work came back he was advised to come to ER. C/O 2-3 week h/o pain in substernal region. States he had EKG which was normal. Exertional SOB. C/o 15 pound weight loss. Denies nausea or vomiting. Received two units of prbcs last night with appropriate response.    No NSAIDs. Takes daily ASA.  Prior to Admission medications   Medication Sig Start Date End Date Taking? Authorizing Provider  aspirin EC 81 MG tablet Take 81 mg by mouth daily.   Yes Historical Provider, MD  metFORMIN (GLUCOPHAGE) 500 MG tablet Take 500 mg by mouth daily.   Yes Historical Provider, MD  simvastatin (ZOCOR) 40 MG tablet Take 40 mg by mouth daily.   Yes Historical Provider, MD    Current Facility-Administered Medications  Medication Dose Route Frequency Provider Last Rate Last Dose  . 0.9 %  sodium chloride infusion   Intravenous Continuous Doree Albee, MD 100 mL/hr at 10/13/13 0332    . insulin aspart (novoLOG) injection 0-9 Units  0-9 Units Subcutaneous TID WC Nimish C Gosrani, MD      . metFORMIN (GLUCOPHAGE) tablet 500 mg  500 mg Oral Q breakfast Nimish C Gosrani, MD      . ondansetron (ZOFRAN) tablet 4 mg  4 mg Oral Q6H PRN Nimish Luther Parody, MD       Or  . ondansetron (ZOFRAN) injection 4 mg  4 mg Intravenous Q6H PRN Nimish C Gosrani, MD      . pantoprazole (PROTONIX) injection 40 mg  40 mg  Intravenous Q24H Nimish C Anastasio Champion, MD   40 mg at 10/12/13 2056  . simvastatin (ZOCOR) tablet 40 mg  40 mg Oral q1800 Doree Albee, MD        Allergies as of 10/12/2013  . (No Known Allergies)    Past Medical History  Diagnosis Date  . Hypertension   . Diabetes 10/12/2013  . Throat cancer 10/12/2013  . Hyperlipidemia 10/12/2013    Past Surgical History  Procedure Laterality Date  . Throat surgery  2013    remove cancer, [facility] [place]  . Colonoscopy      [facility] around 2007? polyps    Family History  Problem Relation Age of Onset  . Colon cancer Neg Hx     History   Social History  . Marital Status: Legally Separated    Spouse Name: N/A    Number of Children: 3  . Years of Education: N/A   Occupational History  . Not on file.   Social History Main Topics  . Smoking status: Former Research scientist (life sciences)  . Smokeless tobacco: Not on file  . Alcohol Use: No  . Drug Use: No  . Sexual Activity: Not on file   Other Topics Concern  . Not on file   Social History Narrative  . No narrative on file     ROS:  General: Negative for fever, chills.  See history of present illness Eyes: Negative for vision changes.  ENT: Negative for hoarseness, difficulty swallowing , nasal congestion. CV: Negative for chest pain, angina, palpitations,  peripheral edema. See history of present illness Respiratory: Negative for dyspnea at rest, cough, sputum, wheezing. See history of present illness. GI: See history of present illness. GU:  Negative for dysuria, hematuria, urinary incontinence, urinary frequency, nocturnal urination.  MS: Negative for joint pain, low back pain.  Derm: Negative for rash or itching.  Neuro: Negative for weakness, abnormal sensation, seizure, frequent headaches, memory loss, confusion.  Psych: Negative for anxiety, depression, suicidal ideation, hallucinations.  Endo: See history of present illness  Heme: Negative for bruising or bleeding. Allergy: Negative for rash or  hives.       Physical Examination: Vital signs in last 24 hours: Temp:  [98.1 F (36.7 C)-98.4 F (36.9 C)] 98.1 F (36.7 C) (03/06 0329) Pulse Rate:  [82-102] 84 (03/06 0329) Resp:  [15-22] 18 (03/06 0329) BP: (132-153)/(65-78) 132/65 mmHg (03/06 0329) SpO2:  [100 %] 100 % (03/06 0329) Weight:  [165 lb (74.844 kg)-166 lb 6.4 oz (75.479 kg)] 166 lb 6.4 oz (75.479 kg) (03/05 2330) Last BM Date: 10/11/13  General: Well-nourished, well-developed in no acute distress.  Head: Normocephalic, atraumatic.   Eyes: Conjunctiva pink, no icterus. Mouth: Oropharyngeal mucosa moist and pink , no lesions erythema or exudate. Neck: Supple without thyromegaly, masses, or lymphadenopathy.  Lungs: Clear to auscultation bilaterally.  Heart: Regular rate and rhythm, no murmurs rubs or gallops.  Abdomen: Bowel sounds are normal, nontender, nondistended, no hepatosplenomegaly or masses, no abdominal bruits or    hernia , no rebound or guarding.   Rectal: Not performed Extremities: No lower extremity edema, clubbing, deformity.  Neuro: Alert and oriented x 4 , grossly normal neurologically.  Skin: Warm and dry, no rash or jaundice.   Psych: Alert and cooperative, normal mood and affect.        Intake/Output from previous day: 03/05 0701 - 03/06 0700 In: 350 [Blood:350] Out: -  Intake/Output this shift:    Lab Results: CBC  Recent Labs  10/12/13 1900 10/13/13 0624  WBC 5.4 4.2  HGB 5.7* 8.1*  HCT 17.0* 23.9*  MCV 82.1 83.9  PLT 180 130*   BMET  Recent Labs  10/12/13 1900 10/13/13 0624  NA 139 138  K 4.6 4.2  CL 97 100  CO2 33* 30  GLUCOSE 140* 92  BUN 21 14  CREATININE 1.25 1.03  CALCIUM 9.4 9.2   LFT  Recent Labs  10/12/13 1900 10/13/13 0624  BILITOT 0.2* 0.5  ALKPHOS 53 40  AST 22 16  ALT 16 14  PROT 6.7 6.3  ALBUMIN 3.3* 3.0*   No results found for this basename: LIPASE    Recent Labs  10/12/13 1900  LABPROT 13.7  INR 1.07      Imaging Studies: No  results found.Minnie.Brome week]   Impression: 68 year old gentleman with history of recurrent throat cancer per history, who presents with complaints of generalized weakness, lightheadedness, recent melena/rectal bleeding. Hemoglobin 5.7 on admission. Takes a daily aspirin but denies any other NSAIDs. Would be concerned about upper GI bleeding based on history.   Plan: 1. Plan for EGD today. Discussed with Dr. Oneida Alar. Suspect some of his substernal discomfort and exertional shortness of breath related to cardiac demand in the setting of profound anemia.  2. PPI.  We would like to thank you for the opportunity to participate in the care of Joseph E  Petersen.   LOS: 1 day   Neil Crouch  10/13/2013, 8:16 AM

## 2013-10-13 NOTE — Care Management Note (Signed)
    Page 1 of 1   10/13/2013     2:37:28 PM   CARE MANAGEMENT NOTE 10/13/2013  Patient:  Joseph Petersen, Joseph Petersen   Account Number:  [id number]  Date Initiated:  10/13/2013  Documentation initiated by:  Claretha Cooper  Subjective/Objective Assessment:   Pt admitted from home where he reportedly lives alone. Followed at the [place].     Action/Plan:   Anticipated DC Date:     Anticipated DC Plan:  HOME/SELF CARE      DC Planning Services  CM consult      Choice offered to / List presented to:             Status of service:  Completed, signed off Medicare Important Message given?   (If response is "NO", the following Medicare IM given date fields will be blank) Date Medicare IM given:   Date Additional Medicare IM given:    Discharge Disposition:    Per UR Regulation:    If discussed at Long Length of Stay Meetings, dates discussed:    Comments:  10/13/13 Claretha Cooper RN BSN CM

## 2013-10-14 DIAGNOSIS — K922 Gastrointestinal hemorrhage, unspecified: Secondary | ICD-10-CM

## 2013-10-14 LAB — CBC
HCT: 23.9 % — ABNORMAL LOW (ref 39.0–52.0)
Hemoglobin: 8.2 g/dL — ABNORMAL LOW (ref 13.0–17.0)
MCH: 28.8 pg (ref 26.0–34.0)
MCHC: 34.3 g/dL (ref 30.0–36.0)
MCV: 83.9 fL (ref 78.0–100.0)
PLATELETS: 117 10*3/uL — AB (ref 150–400)
RBC: 2.85 MIL/uL — ABNORMAL LOW (ref 4.22–5.81)
RDW: 15.5 % (ref 11.5–15.5)
WBC: 5.7 10*3/uL (ref 4.0–10.5)

## 2013-10-14 LAB — TYPE AND SCREEN
ABO/RH(D): O POS
Antibody Screen: NEGATIVE
UNIT DIVISION: 0
Unit division: 0

## 2013-10-14 LAB — GLUCOSE, CAPILLARY: Glucose-Capillary: 103 mg/dL — ABNORMAL HIGH (ref 70–99)

## 2013-10-14 MED ORDER — PANTOPRAZOLE SODIUM 40 MG PO TBEC: 40.0000 mg | DELAYED_RELEASE_TABLET | Freq: Two times a day (BID) | ORAL | Status: DC

## 2013-10-14 NOTE — Discharge Summary (Signed)
Physician Discharge Summary  Joseph Petersen UVO:[id number] DOB: [birth year] DOA: 10/12/2013  PCP: No primary provider on file.  Admit date: 10/12/2013 Discharge date: 10/14/2013  Recommendations for Outpatient Follow-up:  1. Duodenal ulcer 2. Acute blood loss anemia, mild thrombocytopenia. Consider repeat CBC one week in followup. The patient received no heparin products. 3. No aspirin or NSAIDs for 4 weeks.  Discharge Diagnoses:  1. Upper GI bleed secondary to duodenal ulcre 2. Acute blood loss anemia. 3. Diabetes mellitus. 4. History of throat cancer.  Discharge Condition: Improved Disposition: home  Diet recommendation: diabetic diet  Filed Weights   10/12/13 1838 10/12/13 2330  Weight: 74.844 kg (165 lb) 75.479 kg (166 lb 6.4 oz)    History of present illness:  68 year old man presented with history of dizziness, lightheadedness, fatigue, rectal bleeding. Hemoglobin found to be 5.7. Admitted for further evaluation of GI bleed.  Hospital Course:  Patient was transfused 2 units packed red blood cells with appropriate response. No evidence of rebleeding. Underwent endoscopy upper GI with findings of duodenal ulcer. Aspirin discontinued. The patient had brief episode of stridor after endoscopy which was transient and resolved with appropriate treatment. He has tolerated diet and is now stable for discharge. Individual issues as below.  1. Upper GI bleed secondary to duodenal ulcer. Status post endoscopy 3/6. No evidence of recurrent bleeding. Aspirin discontinued. 2. Acute blood loss anemia. Secondary to above. Hemoglobin stable status post 2 units packed red blood cells. 3. Stridor, resolved in PACU. Transient. No recurrence. 4. Diabetes mellitus. Capillary blood sugars stable. Resume metformin on discharge. 5. Throat cancer diagnosed 2013 treated with local radiotherapy and lymph node dissection. Further surgery is planned soon at [place]. PPI twice a day.  No aspirin or NSAIDs for 4  weeks. No anticoagulation for 2 weeks.  Recommend CBC one week, followup in thrombocytopenia. Spontaneous resolution expected.  Consultants:  Gastroenterology Procedures:  EGD colon stricture at gastroesophageal junction, mild nonerosive gastritis, medium-sized ulcer in the duodenal bulb.  Transfusion 2 units packed red blood cells 3/5  Discharge Instructions  Discharge Orders   Future Orders Complete By Expires   Activity as tolerated - No restrictions  As directed    Diet Carb Modified  As directed    Discharge instructions  As directed    Comments:     Call your physician or seek immediate medical attention for bleeding, pain, shortness of breath or worsening of condition.       Medication List    STOP taking these medications       aspirin EC 81 MG tablet      TAKE these medications       metFORMIN 500 MG tablet  Commonly known as:  GLUCOPHAGE  Take 500 mg by mouth daily.     pantoprazole 40 MG tablet  Commonly known as:  PROTONIX  Take 1 tablet (40 mg total) by mouth 2 (two) times daily before a meal.     simvastatin 40 MG tablet  Commonly known as:  ZOCOR  Take 40 mg by mouth daily.       Not on File  The results of significant diagnostics from this hospitalization (including imaging, microbiology, ancillary and laboratory) are listed below for reference.    Labs: Basic Metabolic Panel:  Recent Labs Lab 10/12/13 1900 10/13/13 0624  NA 139 138  K 4.6 4.2  CL 97 100  CO2 33* 30  GLUCOSE 140* 92  BUN 21 14  CREATININE 1.25 1.03  CALCIUM 9.4 9.2  Liver Function Tests:  Recent Labs Lab 10/12/13 1900 10/13/13 0624  AST 22 16  ALT 16 14  ALKPHOS 53 40  BILITOT 0.2* 0.5  PROT 6.7 6.3  ALBUMIN 3.3* 3.0*   CBC:  Recent Labs Lab 10/12/13 1900 10/13/13 0624 10/14/13 0546  WBC 5.4 4.2 5.7  NEUTROABS 4.5  --   --   HGB 5.7* 8.1* 8.2*  HCT 17.0* 23.9* 23.9*  MCV 82.1 83.9 83.9  PLT 180 130* 117*   CBG:  Recent Labs Lab  10/13/13 1132 10/13/13 1239 10/13/13 1619 10/13/13 2150 10/14/13 0801  GLUCAP 95 103* 113* 91 103*    Principal Problem:   Duodenal ulcer with hemorrhage Active Problems:   Anemia   Throat cancer   Diabetes   Hyperlipidemia   Acute blood loss anemia   UGIB (upper gastrointestinal bleed)   Time coordinating discharge: 25 minutes  Signed:  Murray Hodgkins, MD [facility] 10/14/2013, 9:55 AM

## 2013-10-14 NOTE — Consult Note (Signed)
REVIEWED.  

## 2013-10-14 NOTE — Progress Notes (Signed)
Subjective: Since I last evaluated the patient no rectal bleeding or melena. TOLERATING POS. HAS PRE-OP APPT ON MON AT [place].  Objective: Vital signs in last 24 hours: Temp:  [98 F (36.7 C)] 98 F (36.7 C) (03/06 2152) Pulse Rate:  [85-107] 96 (03/06 2152) Resp:  [13-26] 16 (03/06 2152) BP: (103-175)/(62-118) 138/78 mmHg (03/06 2152) SpO2:  [95 %-100 %] 100 % (03/06 2152) Last BM Date: 10/12/13  Intake/Output from previous day: 03/06 0701 - 03/07 0700 In: 0  Out: 600 [Urine:600] Intake/Output this shift:   General appearance: alert, cooperative and no distress Resp: clear to auscultation bilaterally Cardio: regular rate and rhythm GI: soft, non-tender; bowel sounds normal;  Lab Results:  Recent Labs  10/12/13 1900 10/13/13 0624 10/14/13 0546  WBC 5.4 4.2 5.7  HGB 5.7* 8.1* 8.2*  HCT 17.0* 23.9* 23.9*  PLT 180 130* 117*   BMET  Recent Labs  10/12/13 1900 10/13/13 0624  NA 139 138  K 4.6 4.2  CL 97 100  CO2 33* 30  GLUCOSE 140* 92  BUN 21 14  CREATININE 1.25 1.03  CALCIUM 9.4 9.2   LFT  Recent Labs  10/13/13 0624  PROT 6.3  ALBUMIN 3.0*  AST 16  ALT 14  ALKPHOS 40  BILITOT 0.5   PT/INR  Recent Labs  10/12/13 1900  LABPROT 13.7  INR 1.07   Medications: I have reviewed the patient's current medications.  Assessment/Plan: ADMITTED WITH GI BLEED DUE TO DUODENAL ULCER IN SETTING OF ASA USE. CLINICALLY IMPROVED. HB STABLE.  PLAN: 1. KEEP PRE-OP APPT MON AT [place]. WILL CALL ENT MD ON MON TO [place] STAY/FINDINGS. 2. BID PPI FOR 3 MOS THEN DAILY 3. SOFT MECHANICAL DIET. MEATS SHOULD BE CHOPPED OR GROUND. DO NOT EAT CHUNKS OF ANYTHING DUE TO ESOPHAGEAL STRUCTURE. DISCUSSED WITH PT AND FAMILY. 4. OPV IN 3 MOS W/ DR. Darrien Laakso. PT WILL NEED REPEAT EGD/DIL AFTER HE RECOVERS FROM THROAT SURGERY.    LOS: 2 days   [facility] 10/14/2013, 8:50 AM

## 2013-10-14 NOTE — Progress Notes (Signed)
  PROGRESS NOTE  DAWUD MAYS JGG:[id number] DOB: 09/17/1945 DOA: 10/12/2013 PCP: No primary provider on file. [facility]  Summary: 68 year old man presented with history of dizziness, lightheadedness, fatigue, rectal bleeding. Hemoglobin found to be 5.7. Admitted for further evaluation of GI bleed.  Assessment/Plan: 1. Upper GI bleed secondary to duodenal ulcer. Status post endoscopy 3/6. No evidence of recurrent bleeding. Aspirin discontinued. 2. Acute blood loss anemia. Secondary to above. Hemoglobin stable status post 2 units packed red blood cells. 3. Stridor, resolved in PACU. Transient. No recurrence. 4. Diabetes mellitus. Capillary blood sugars stable. Resume metformin on discharge. 5. Throat cancer diagnosed 2013 treated with local radiotherapy and lymph node dissection. Further surgery is planned soon at [place].   PPI twice a day.  No aspirin or NSAIDs for 4 weeks. No anticoagulation for 2 weeks.  Home today.  Recommend CBC one week, followup in thrombocytopenia. Spontaneous resolution expected.  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  Transfusion 2 units packed red blood cells 3/5  HPI/Subjective: No issues overnight. No pain. No shortness of breath. No bleeding. Tolerating diet. Wants to go home.  Objective: Filed Vitals:   10/13/13 1455 10/13/13 1515 10/13/13 1530 10/13/13 2152  BP:  128/68 131/71 138/78  Pulse: 90 96 93 96  Temp:    98 F (36.7 C)  TempSrc:    Oral  Resp: 14 17 18 16   Height:      Weight:      SpO2: 100% 100% 98% 100%    Intake/Output Summary (Last 24 hours) at 10/14/13 0952 Last data filed at 10/14/13 0906  Gross per 24 hour  Intake    240 ml  Output     600 ml  Net   -360 ml     Filed Weights   10/12/13 1838 10/12/13 2330  Weight: 74.844 kg (165 lb) 75.479 kg (166 lb 6.4 oz)    Exam:   Afebrile. Vital signs stable.  General. Appears calm and comfortable. Speech fluent and clear.  Cardiovascular regular rate and rhythm. No murmur, rub or gallop.  Telemetry sinus rhythm  Respiratory clear to auscultation bilaterally. No wheezes, rales or rhonchi. Normal respiratory effort.  Psychiatric grossly normal mood and affect. Speech fluent and appropriate.  Data Reviewed:  Capillary blood sugars stable. No hypoglycemia.  Hemoglobin stable at 8.2. Mild thrombocytopenia seen.  Scheduled Meds: . feeding supplement (ENSURE COMPLETE)  237 mL Oral BID BM  . flumazenil  0.5 mg Intravenous Once  . insulin aspart  0-9 Units Subcutaneous TID WC  . naloxone  0.4 mg Intravenous Once  . pantoprazole  40 mg Oral BID AC  . simvastatin  40 mg Oral q1800   Continuous Infusions:    Principal Problem:   Duodenal ulcer with hemorrhage Active Problems:   Anemia   Throat cancer   Diabetes   Hyperlipidemia   Acute blood loss anemia   UGIB (upper gastrointestinal bleed)

## 2013-10-14 NOTE — Progress Notes (Signed)
Patient received discharge instructions along with follow up appointments and prescriptions. Patient verbalized understanding of all instructions. Patient was escorted by staff via wheelchair to vehicle. Patient discharged to home in stable condition. 

## 2013-10-16 ENCOUNTER — Encounter (HOSPITAL_COMMUNITY): Payer: Self-pay | Admitting: Gastroenterology

## 2013-10-16 ENCOUNTER — Telehealth: Payer: Self-pay | Admitting: Gastroenterology

## 2013-10-16 NOTE — Telephone Encounter (Signed)
SPOKE WITH DR. KAHMKE. AWARE PT HOSPITALIZED FOR GI BLEED DUE TO A DUODENAL ULCER. ENT SURGERY PLANNED FOR NEXT TUES. SEND COPY OF OP NOTE TO DR. KAHMKE.

## 2013-10-16 NOTE — Telephone Encounter (Signed)
Called patient TO [place]. LEFT MESSAGE WITH AKEMI. [facility] WILL HAVE ENT CLINIC CONTACT ME AT [phone].

## 2013-10-17 ENCOUNTER — Telehealth: Payer: Self-pay | Admitting: Gastroenterology

## 2013-10-17 NOTE — Telephone Encounter (Signed)
Called and informed pt. Called the prescriptions to [facility] in [place]. Spoke to [place] and she will note the DO NOT TAKE ZOCOR AND CONTINUE PROTONIX BID. ( Pt was also informed of this).

## 2013-10-17 NOTE — Telephone Encounter (Signed)
PLEASE CALL PT. HIS stomach Bx showed H. Pylori infection. He needs AMOXICILLIN 500 mg 2 po BID for 10 days and Biaxin 500 mg po bid for 10 days, #QS, RFX0. DO NOT TAKE ZOCOR WHILE TAKING ABX. CONTINUE PROTONIX BID. Med side effects include NVD, abd pain, and metallic taste. OPV IN JUL 2015 E30 H PYLORI GASTRISTIS, GIB FROM DUODENAL ULCER.  ASK PT WHERE HE WOULD LIKE HIS RX MEDS SENT. THEN CALL RX TO PHARMACY.

## 2013-10-17 NOTE — Telephone Encounter (Signed)
Records faxed to Dr. Mickel Fuchs ENT at the [facility] verified that this pt is a pt there.

## 2013-10-17 NOTE — Telephone Encounter (Signed)
I called [facility] to get a fax number to fax op note for this pt to Dr. Mickel Fuchs, I spoke with Arville Go she stated that Dr. Mickel Fuchs his name is Dr. Cristina Gong is a resident there he does not have a schedule, pt has never been seen there, Joann also connected me to [facility] at the main office to be sure this pt has not been seen there, and Jinny Blossom stated also that this pt is not a pt there. Is there another office I need to contact?

## 2013-10-18 ENCOUNTER — Encounter (HOSPITAL_COMMUNITY): Payer: Self-pay | Admitting: Emergency Medicine

## 2013-10-18 ENCOUNTER — Emergency Department (HOSPITAL_COMMUNITY): Payer: Medicare HMO

## 2013-10-18 ENCOUNTER — Inpatient Hospital Stay (HOSPITAL_COMMUNITY)
Admission: EM | Admit: 2013-10-18 | Discharge: 2013-10-23 | DRG: 377 | Disposition: A | Discharge status: Home / Self Care | Payer: Medicare HMO | Attending: Internal Medicine | Admitting: Internal Medicine

## 2013-10-18 DIAGNOSIS — D62 Acute posthemorrhagic anemia: Secondary | ICD-10-CM | POA: Diagnosis present

## 2013-10-18 DIAGNOSIS — IMO0002 Reserved for concepts with insufficient information to code with codable children: Secondary | ICD-10-CM

## 2013-10-18 DIAGNOSIS — E43 Unspecified severe protein-calorie malnutrition: Secondary | ICD-10-CM | POA: Insufficient documentation

## 2013-10-18 DIAGNOSIS — I10 Essential (primary) hypertension: Secondary | ICD-10-CM | POA: Diagnosis present

## 2013-10-18 DIAGNOSIS — K222 Esophageal obstruction: Secondary | ICD-10-CM | POA: Diagnosis present

## 2013-10-18 DIAGNOSIS — R64 Cachexia: Secondary | ICD-10-CM | POA: Diagnosis present

## 2013-10-18 DIAGNOSIS — K5710 Diverticulosis of small intestine without perforation or abscess without bleeding: Secondary | ICD-10-CM | POA: Diagnosis present

## 2013-10-18 DIAGNOSIS — K5731 Diverticulosis of large intestine without perforation or abscess with bleeding: Secondary | ICD-10-CM | POA: Diagnosis present

## 2013-10-18 DIAGNOSIS — C140 Malignant neoplasm of pharynx, unspecified: Secondary | ICD-10-CM

## 2013-10-18 DIAGNOSIS — R55 Syncope and collapse: Secondary | ICD-10-CM

## 2013-10-18 DIAGNOSIS — K922 Gastrointestinal hemorrhage, unspecified: Secondary | ICD-10-CM

## 2013-10-18 DIAGNOSIS — K5730 Diverticulosis of large intestine without perforation or abscess without bleeding: Secondary | ICD-10-CM

## 2013-10-18 DIAGNOSIS — K264 Chronic or unspecified duodenal ulcer with hemorrhage: Principal | ICD-10-CM | POA: Diagnosis present

## 2013-10-18 DIAGNOSIS — D649 Anemia, unspecified: Secondary | ICD-10-CM

## 2013-10-18 DIAGNOSIS — Z79899 Other long term (current) drug therapy: Secondary | ICD-10-CM

## 2013-10-18 DIAGNOSIS — E785 Hyperlipidemia, unspecified: Secondary | ICD-10-CM

## 2013-10-18 DIAGNOSIS — Z923 Personal history of irradiation: Secondary | ICD-10-CM

## 2013-10-18 DIAGNOSIS — C760 Malignant neoplasm of head, face and neck: Secondary | ICD-10-CM | POA: Diagnosis present

## 2013-10-18 DIAGNOSIS — R634 Abnormal weight loss: Secondary | ICD-10-CM | POA: Diagnosis present

## 2013-10-18 DIAGNOSIS — E119 Type 2 diabetes mellitus without complications: Secondary | ICD-10-CM | POA: Diagnosis present

## 2013-10-18 DIAGNOSIS — R1310 Dysphagia, unspecified: Secondary | ICD-10-CM | POA: Diagnosis not present

## 2013-10-18 DIAGNOSIS — T3995XA Adverse effect of unspecified nonopioid analgesic, antipyretic and antirheumatic, initial encounter: Secondary | ICD-10-CM | POA: Diagnosis present

## 2013-10-18 DIAGNOSIS — Z87891 Personal history of nicotine dependence: Secondary | ICD-10-CM

## 2013-10-18 DIAGNOSIS — K2210 Ulcer of esophagus without bleeding: Secondary | ICD-10-CM | POA: Diagnosis present

## 2013-10-18 LAB — PREPARE RBC (CROSSMATCH)

## 2013-10-18 LAB — CBC WITH DIFFERENTIAL/PLATELET
BASOS ABS: 0 10*3/uL (ref 0.0–0.1)
Basophils Relative: 0 % (ref 0–1)
Eosinophils Absolute: 0 10*3/uL (ref 0.0–0.7)
Eosinophils Relative: 0 % (ref 0–5)
HCT: 18.7 % — ABNORMAL LOW (ref 39.0–52.0)
Hemoglobin: 6.2 g/dL — CL (ref 13.0–17.0)
LYMPHS PCT: 4 % — AB (ref 12–46)
Lymphs Abs: 0.3 10*3/uL — ABNORMAL LOW (ref 0.7–4.0)
MCH: 27.8 pg (ref 26.0–34.0)
MCHC: 33.2 g/dL (ref 30.0–36.0)
MCV: 83.9 fL (ref 78.0–100.0)
Monocytes Absolute: 0.4 10*3/uL (ref 0.1–1.0)
Monocytes Relative: 6 % (ref 3–12)
NEUTROS PCT: 90 % — AB (ref 43–77)
Neutro Abs: 6.4 10*3/uL (ref 1.7–7.7)
PLATELETS: 172 10*3/uL (ref 150–400)
RBC: 2.23 MIL/uL — AB (ref 4.22–5.81)
RDW: 15.2 % (ref 11.5–15.5)
WBC: 7.1 10*3/uL (ref 4.0–10.5)

## 2013-10-18 LAB — BASIC METABOLIC PANEL
BUN: 18 mg/dL (ref 6–23)
CO2: 29 meq/L (ref 19–32)
Calcium: 8.2 mg/dL — ABNORMAL LOW (ref 8.4–10.5)
Chloride: 96 mEq/L (ref 96–112)
Creatinine, Ser: 1.08 mg/dL (ref 0.50–1.35)
GFR calc Af Amer: 80 mL/min — ABNORMAL LOW (ref >90)
GFR, EST NON AFRICAN AMERICAN: 69 mL/min — AB (ref >90)
GLUCOSE: 205 mg/dL — AB (ref 70–99)
POTASSIUM: 4.8 meq/L (ref 3.7–5.3)
SODIUM: 133 meq/L — AB (ref 137–147)

## 2013-10-18 LAB — GLUCOSE, CAPILLARY
Glucose-Capillary: 95 mg/dL (ref 70–99)
Glucose-Capillary: 97 mg/dL (ref 70–99)

## 2013-10-18 LAB — TROPONIN I

## 2013-10-18 LAB — CBG MONITORING, ED: Glucose-Capillary: 102 mg/dL — ABNORMAL HIGH (ref 70–99)

## 2013-10-18 LAB — MRSA PCR SCREENING: MRSA by PCR: NEGATIVE

## 2013-10-18 MED ORDER — SODIUM CHLORIDE 0.9 % IV SOLN
8.0000 mg/h | INTRAVENOUS | Status: AC
  Administered 2013-10-18 – 2013-10-21 (×7): 8 mg/h via INTRAVENOUS
  Filled 2013-10-18 (×14): qty 80

## 2013-10-18 MED ORDER — INSULIN ASPART 100 UNIT/ML ~~LOC~~ SOLN
0.0000 [IU] | Freq: Every day | SUBCUTANEOUS | Status: DC
Start: 2013-10-18 — End: 2013-10-19

## 2013-10-18 MED ORDER — PANTOPRAZOLE SODIUM 40 MG IV SOLR: 40.0000 mg | Freq: Two times a day (BID) | INTRAVENOUS | Status: DC

## 2013-10-18 MED ORDER — ONDANSETRON HCL 4 MG PO TABS: 4.0000 mg | ORAL_TABLET | Freq: Four times a day (QID) | ORAL | Status: DC | PRN

## 2013-10-18 MED ORDER — PANTOPRAZOLE SODIUM 40 MG IV SOLR
40.0000 mg | Freq: Once | INTRAVENOUS | Status: AC
  Administered 2013-10-18: 40 mg via INTRAVENOUS
  Filled 2013-10-18: qty 40

## 2013-10-18 MED ORDER — INSULIN ASPART 100 UNIT/ML ~~LOC~~ SOLN: 0.0000 [IU] | Freq: Three times a day (TID) | SUBCUTANEOUS | Status: DC

## 2013-10-18 MED ORDER — ONDANSETRON HCL 4 MG/2ML IJ SOLN: 4.0000 mg | Freq: Four times a day (QID) | INTRAMUSCULAR | Status: DC | PRN

## 2013-10-18 MED ORDER — SODIUM CHLORIDE 0.9 % IV SOLN
INTRAVENOUS | Status: DC
  Administered 2013-10-18 – 2013-10-20 (×4): via INTRAVENOUS
  Administered 2013-10-21: 999 mL via INTRAVENOUS
  Administered 2013-10-22: 1000 mL via INTRAVENOUS
  Administered 2013-10-22: 21:00:00 via INTRAVENOUS
  Administered 2013-10-23: 1000 mL via INTRAVENOUS

## 2013-10-18 MED ORDER — PANTOPRAZOLE SODIUM 40 MG IV SOLR
INTRAVENOUS | Status: AC
  Filled 2013-10-18: qty 80

## 2013-10-18 MED ORDER — SODIUM CHLORIDE 0.9 % IV BOLUS (SEPSIS)
500.0000 mL | Freq: Once | INTRAVENOUS | Status: AC
  Administered 2013-10-18: 500 mL via INTRAVENOUS

## 2013-10-18 MED ORDER — SODIUM CHLORIDE 0.9 % IV BOLUS (SEPSIS): 500.0000 mL | Freq: Once | INTRAVENOUS | Status: DC

## 2013-10-18 NOTE — ED Notes (Signed)
POC CBG Result: 102

## 2013-10-18 NOTE — ED Notes (Signed)
Patient arrives via EMS from home with c/o dizziness and hypotension. Patient states he was going to restroom, when he stood he became dizzy and diaphoretic. Patient states he sat down when this happened. EMS reports that fire department reports hypotension at 86L systolic, pale when arrived on scene. Patient alert/oriented x 4 now. Recent d/c from hospital for GI bleed and anemia with transfusion.

## 2013-10-18 NOTE — ED Provider Notes (Signed)
CSN: [id number]     Arrival date & time 10/18/13  1638 History  This chart was scribed for Sharyon Cable, MD by Elby Beck, ED Scribe. This patient was seen in room APA06/APA06 and the patient's care was started at 5:04 PM.  Chief Complaint  Patient presents with  . Hypotension    Patient is a 68 y.o. male presenting with hematochezia. The history is provided by the patient. No language interpreter was used.  Rectal Bleeding Quality: "dark", noticed in stool today. Amount:  Scant Duration:  1 day Timing:  Intermittent Progression:  Unchanged Chronicity:  Recurrent Context comment:  Recent hospital stay for a bleeding ulcer Relieved by:  None tried Worsened by:  Nothing tried Ineffective treatments:  None tried Associated symptoms: light-headedness   Associated symptoms: no loss of consciousness and no vomiting     HPI Comments: Joseph Petersen is a 68 y.o. male with a history of HTN, DM and throat CA brought by EMS to the Emergency Department complaining of dark blood in his stool noticed earlier today. He states that after this bloody stool, he had some lightheadedness and a near syncopal episode. He also notes that he has had a headache and some SOB today. He reports that he has had chronic, aching chest pain for the past 2-3 months.  EMS reported that pt was hypotensive with a systolic pressure in the 0000000. ED blood pressure is 112/60. He reports that he was only recently discharged from a hospital stay after having a bleeding ulcer, which he states required a blood transfusion. He denies any vomiting or LOC.  Pt is seen at the [facility]   Past Medical History  Diagnosis Date  . Hypertension   . Diabetes 10/12/2013  . Throat cancer 10/12/2013  . Hyperlipidemia 10/12/2013   Past Surgical History  Procedure Laterality Date  . Throat surgery  2013    remove cancer, [facility] [place]  . Colonoscopy      [facility] around 2007? polyps  . Esophagogastroduodenoscopy N/A 10/13/2013     Procedure: ESOPHAGOGASTRODUODENOSCOPY (EGD);  Surgeon: Danie Binder, MD;  Location: [facility];  Service: Endoscopy;  Laterality: N/A;   Family History  Problem Relation Age of Onset  . Colon cancer Neg Hx    History  Substance Use Topics  . Smoking status: Former Research scientist (life sciences)  . Smokeless tobacco: Not on file  . Alcohol Use: No    Review of Systems  Respiratory: Positive for shortness of breath.   Cardiovascular: Positive for chest pain (chronic).  Gastrointestinal: Positive for blood in stool and hematochezia. Negative for vomiting.  Neurological: Positive for light-headedness and headaches. Negative for loss of consciousness and syncope.  All other systems reviewed and are negative.   Allergies  Review of patient's allergies indicates no known allergies.  Home Medications   Current Outpatient Rx  Name  Route  Sig  Dispense  Refill  . amoxicillin (AMOXIL) 500 MG capsule   Oral   Take 500 mg by mouth 4 (four) times daily.         . clarithromycin (BIAXIN) 500 MG tablet   Oral   Take 500 mg by mouth 2 (two) times daily.         . metFORMIN (GLUCOPHAGE) 500 MG tablet   Oral   Take 500 mg by mouth daily.         . pantoprazole (PROTONIX) 40 MG tablet   Oral   Take 1 tablet (40 mg total) by  mouth 2 (two) times daily before a meal.   60 tablet   0   . simvastatin (ZOCOR) 40 MG tablet   Oral   Take 40 mg by mouth daily.          Triage Vitals: BP 112/60  Pulse 103  Temp(Src) 97.6 F (36.4 C) (Oral)  Resp 16  Ht 6' 4.5" (1.943 m)  Wt 162 lb (73.483 kg)  BMI 19.46 kg/m2  SpO2 100%  Physical Exam  Nursing note and vitals reviewed. CONSTITUTIONAL: Well developed/well nourished HEAD: Normocephalic/atraumatic EYES: EOMI/PERRL ENMT: Mucous membranes moist, hoarse voice (baseline) NECK: supple no meningeal signs SPINE:entire spine nontender CV: S1/S2 noted, no murmurs/rubs/gallops noted LUNGS: Lungs are clear to auscultation bilaterally, no apparent  distress ABDOMEN: soft, nontender, no rebound or guarding RECTAL: Bloody stool noted (Chaperone present) HEMOCCULT POSITIVE GU:no cva tenderness NEURO: Pt is awake/alert, moves all extremitiesx4 EXTREMITIES: pulses normal, full ROM SKIN: warm, color normal PSYCH: no abnormalities of mood noted   ED Course  Procedures  CRITICAL CARE Performed by: Sharyon Cable Total critical care time: 33 Critical care time was exclusive of separately billable procedures and treating other patients. Critical care was necessary to treat or prevent imminent or life-threatening deterioration. Critical care was time spent personally by me on the following activities: development of treatment plan with patient and/or surrogate as well as nursing, discussions with consultants, evaluation of patient's response to treatment, examination of patient, obtaining history from patient or surrogate, ordering and performing treatments and interventions, ordering and review of laboratory studies, ordering and review of radiographic studies, pulse oximetry and re-evaluation of patient's condition.  PATIENT WITH ACUTE ANEMIA (HGB - 6) WITH NEAR SYNCOPE AND HYPOTENSION AT HOME REQUIRING BLOOD TRANSFUSION AND STEPDOWN ADMISSION PT ALSO GIVEN PROTONIX  DIAGNOSTIC STUDIES: Oxygen Saturation is 100% on RA, normal by my interpretation.    COORDINATION OF CARE: 5:11 PM- Performed rectal exam and discussed this with pt (Chaperione present). Discussed plan to obtain diagnostic lab work and radiology.Will also order IV fluids. Pt advised of plan for treatment and pt agrees.  6:28 PM PT FOUND TO HAVE ACUTE ANEMIA HE AGREES TO HAVE BLOOD TRANSFUSION - WE DISCUSSED RISKS OF BLOOD TRANSFUSION HE HAS BEEN STABLE HERE (SBP >100) HE ADMITS TO USING [facility] RECENTLY WHICH HAS ASA AS INGREDIENT D/W DR Gala Romney WITH GI HE IS AWARE OF PATIENT AND MAY NEED EMERGENT SCOPE D/W DR Anastasio Champion, WILL ADMIT TO STEPDOWN PT AGREEABLE WITH  PLAN  Labs Review Labs Reviewed  CBC WITH DIFFERENTIAL - Abnormal; Notable for the following:    RBC 2.23 (*)    Hemoglobin 6.2 (*)    HCT 18.7 (*)    Neutrophils Relative % 90 (*)    Lymphocytes Relative 4 (*)    Lymphs Abs 0.3 (*)    All other components within normal limits  BASIC METABOLIC PANEL - Abnormal; Notable for the following:    Sodium 133 (*)    Glucose, Bld 205 (*)    Calcium 8.2 (*)    GFR calc non Af Amer 69 (*)    GFR calc Af Amer 80 (*)    All other components within normal limits  TROPONIN I  OCCULT BLOOD X 1 CARD TO LAB, STOOL  TYPE AND SCREEN  PREPARE RBC (CROSSMATCH)   Imaging Review Dg Chest Portable 1 View  10/18/2013   CLINICAL DATA Shortness of breath.  History throat cancer.  EXAM PORTABLE CHEST - 1 VIEW  COMPARISON None.  FINDINGS Mild  prominence of the mediastinum is noted. This may be from prominent great vessels however mass lesion or adenopathy cannot be excluded. Lungs are clear of acute infiltrates. Left apical pleural thickening is noted. Pleural based left apical pleural or pulmonary mass cannot be excluded. Adjacent surgical clips noted over the left apex. No pleural effusion or pneumothorax. No focal osseous abnormality. Heart size and pulmonary vascularity normal.  IMPRESSION 1. Left apical pleural-based density. CT of the chest suggested for further evaluation. 2. Mild prominence of the superior mediastinum, most likely vascular, superior mediastinal mass cannot be completely excluded and CT would be useful for further evaluation of this region as well. 3. No acute cardiopulmonary disease.  No focal pulmonary infiltrate.  SIGNATURE  Electronically Signed   By: Marcello Moores  Register   On: 10/18/2013 17:37     Date: 10/18/2013  Rate: 100  Rhythm: sinus tachycardia  QRS Axis: normal  Intervals: normal  ST/T Wave abnormalities: normal  Conduction Disutrbances:none     MDM   Final diagnoses:  Acute blood loss anemia  GI bleed  Near syncope     Nursing notes including past medical history and social history reviewed and considered in documentation Previous records reviewed and considered - recent admission for duodenal ulcer Labs/vital reviewed and considered    I personally performed the services described in this documentation, which was scribed in my presence. The recorded information has been reviewed and is accurate.          Sharyon Cable, MD 10/18/13 [phone]

## 2013-10-18 NOTE — H&P (Addendum)
[facility] History and Physical  MIKYLE Petersen ZOX:[id number] DOB: July 09, 1946 DOA: 10/18/2013  Referring physician: ER. PCP: No primary provider on file.   Chief Complaint: Rectal bleeding.  HPI: Joseph Petersen is a 68 y.o. male  This 68 year old man was recently discharged having been admitted with upper GI bleed secondary to be bleeding duodenal ulcer. His hemoglobin on that admission was 5.7 and he was transfused 2 units of blood. His discharge hemoglobin was 8.2. He was discharged from the hospital 4 days ago. Today he became lightheaded and dizzy and also had further rectal bleeding once again. He has no hematemesis. There is no abdominal pain. He is a man who also has a history of throat cancer. This was diagnosed in 2013 and 4 which she has had local radiotherapy and lymph node dissection. He is apparently due to have further surgery on his throat soon in the [place] system. He describes a weight loss of 15 pounds in the last couple of months.   Review of Systems:  Constitutional:  No  night sweats, HEENT:  No headaches, Difficulty swallowing,Tooth/dental problems,Sore throat,  No sneezing, itching, ear ache, nasal congestion, post nasal drip,  Cardio-vascular:  No chest pain, Orthopnea, PND, swelling in lower extremities, anasarca, dizziness, palpitations  GI:  No heartburn, indigestion, abdominal pain, nausea, vomiting, diarrhea, change in bowel habits. Resp:  No shortness of breath with exertion or at rest. No excess mucus, no productive cough, No non-productive cough, No coughing up of blood.No change in color of mucus.No wheezing.No chest wall deformity  Skin:  no rash or lesions.  GU:  no dysuria, change in color of urine, no urgency or frequency. No flank pain.  Musculoskeletal:  No joint pain or swelling. No decreased range of motion. No back pain.  Psych:  No change in mood or affect. No depression or anxiety. No memory loss.   Past Medical History  Diagnosis  Date  . Hypertension   . Diabetes 10/12/2013  . Throat cancer 10/12/2013  . Hyperlipidemia 10/12/2013   Past Surgical History  Procedure Laterality Date  . Throat surgery  2013    remove cancer, [facility] [place]  . Colonoscopy      [facility] around 2007? polyps  . Esophagogastroduodenoscopy N/A 10/13/2013    Procedure: ESOPHAGOGASTRODUODENOSCOPY (EGD);  Surgeon: Danie Binder, MD;  Location: [facility];  Service: Endoscopy;  Laterality: N/A;   Social History:  reports that he has quit smoking. He does not have any smokeless tobacco history on file. He reports that he does not drink alcohol or use illicit drugs.  No Known Allergies  Family History  Problem Relation Age of Onset  . Colon cancer Neg Hx      Prior to Admission medications   Medication Sig Start Date End Date Taking? Authorizing Provider  amoxicillin (AMOXIL) 500 MG capsule Take 500 mg by mouth 4 (four) times daily. 10/17/13  Yes Historical Provider, MD  clarithromycin (BIAXIN) 500 MG tablet Take 500 mg by mouth 2 (two) times daily. 10/17/13  Yes Historical Provider, MD  metFORMIN (GLUCOPHAGE) 500 MG tablet Take 500 mg by mouth daily.   Yes Historical Provider, MD  pantoprazole (PROTONIX) 40 MG tablet Take 1 tablet (40 mg total) by mouth 2 (two) times daily before a meal. 10/14/13  Yes Samuella Cota, MD  simvastatin (ZOCOR) 40 MG tablet Take 40 mg by mouth daily.   Yes Historical Provider, MD   Physical Exam: Filed Vitals:   10/18/13 1829  BP: 115/68  Pulse: 101  Temp:   Resp: 22    BP 115/68  Pulse 101  Temp(Src) 97.6 F (36.4 C) (Oral)  Resp 22  Ht 6' 4.5" (1.943 m)  Wt 73.483 kg (162 lb)  BMI 19.46 kg/m2  SpO2 100%  General:  Appears calm and comfortable. He looks pale. Eyes: PERRL, normal lids, irises & conjunctiva ENT: grossly normal hearing, lips & tongue Neck: no LAD, masses or thyromegaly Cardiovascular: RRR, no m/r/g. No LE edema. Telemetry: SR, no arrhythmias  Respiratory: CTA bilaterally, no w/r/r.  Normal respiratory effort. Abdomen: soft, ntnd Skin: no rash or induration seen on limited exam Musculoskeletal: grossly normal tone BUE/BLE Psychiatric: grossly normal mood and affect, speech fluent and appropriate Neurologic: grossly non-focal.          Labs on Admission:  Basic Metabolic Panel:  Recent Labs Lab 10/12/13 1900 10/13/13 0624 10/18/13 1653  NA 139 138 133*  K 4.6 4.2 4.8  CL 97 100 96  CO2 33* 30 29  GLUCOSE 140* 92 205*  BUN 21 14 18   CREATININE 1.25 1.03 1.08  CALCIUM 9.4 9.2 8.2*   Liver Function Tests:  Recent Labs Lab 10/12/13 1900 10/13/13 0624  AST 22 16  ALT 16 14  ALKPHOS 53 40  BILITOT 0.2* 0.5  PROT 6.7 6.3  ALBUMIN 3.3* 3.0*     CBC:  Recent Labs Lab 10/12/13 1900 10/13/13 0624 10/14/13 0546 10/18/13 1653  WBC 5.4 4.2 5.7 7.1  NEUTROABS 4.5  --   --  6.4  HGB 5.7* 8.1* 8.2* 6.2*  HCT 17.0* 23.9* 23.9* 18.7*  MCV 82.1 83.9 83.9 83.9  PLT 180 130* 117* 172   Cardiac Enzymes:  Recent Labs Lab 10/18/13 1653  TROPONINI <0.30     CBG:  Recent Labs Lab 10/13/13 1239 10/13/13 1619 10/13/13 2150 10/14/13 0801 10/18/13 1910  GLUCAP 103* 113* 91 103* 102*    Radiological Exams on Admission: Dg Chest Portable 1 View  10/18/2013   CLINICAL DATA Shortness of breath.  History throat cancer.  EXAM PORTABLE CHEST - 1 VIEW  COMPARISON None.  FINDINGS Mild prominence of the mediastinum is noted. This may be from prominent great vessels however mass lesion or adenopathy cannot be excluded. Lungs are clear of acute infiltrates. Left apical pleural thickening is noted. Pleural based left apical pleural or pulmonary mass cannot be excluded. Adjacent surgical clips noted over the left apex. No pleural effusion or pneumothorax. No focal osseous abnormality. Heart size and pulmonary vascularity normal.  IMPRESSION 1. Left apical pleural-based density. CT of the chest suggested for further evaluation. 2. Mild prominence of the superior  mediastinum, most likely vascular, superior mediastinal mass cannot be completely excluded and CT would be useful for further evaluation of this region as well. 3. No acute cardiopulmonary disease.  No focal pulmonary infiltrate.  SIGNATURE  Electronically Signed   By: Marcello Moores  Register   On: 10/18/2013 17:37      Assessment/Plan   1. GI bleed, likely upper based on recent history. He had a duodenal ulcer found on EGD and also stricture of the gastroesophageal junction. Biopsies apparently have been taken. 2. Acute blood loss anemia, hemoglobin 6.2 now. 3. History of throat cancer diagnosed in 2013. Status post surgery and radiotherapy. He is due for further surgery on his throat. 4. Abnormal chest x-ray. 5. Type 2 diabetes mellitus.  Plan: 1. Admit to step down unit. 2. Give 2 units of blood now and 2 units of  blood tomorrow. 3. N.p.o. 4. Gastroenterology consultation. 5. Abnormal chest x-ray-CT chest scan to further evaluate, especially in view of history of throat cancer.  6. Sliding scale of insulin to control diabetes.  Further recommendations will depend on patient's hospital progress.  Code Status: Full code.   Family Communication: Discuss plan with patient and family members at the bedside.   Disposition Plan: Depending on progress.   Time spent: 45 minutes.  Doree Albee [facility] [phone].

## 2013-10-18 NOTE — Consult Note (Addendum)
Referring Provider: No ref. provider found Primary Care Physician:  No primary provider on file. Primary Gastroenterologist:  Dr. Oneida Alar  Reason for Consultation:  GI bleed; severe anemia  HPI: Very pleasant 68 year old African American male with head and neck cancer admitted to this hospital on March 6 with 3 history of melena and a hemoglobin in the 5 range.  EGD on March 7 demonstrated a respectable duodenal ulcer with a clean base which did not require bleeding control therapy. Biopsies were positive for H. Pylori;  patient also had a EG junction stricture which was easily traversed with the scope.  Patient readily admits to going home and taking Alka-Seltzer every day for "indigestion" since his recent hospitalization. He experienced presyncope this evening and had dark bloody stool. He presented to the ED where saw Dr. Christy Gentles. He did have some old blood mixed with stool on digital rectal exam. He was initially hypotensive via EMS but by the time he got to the emergency room his blood pressure was up in the 811 range systolic. He has been given a fluid bolus. He is in the process receiving 2 units of packed red blood cells.  She tells me he took  antibiotics for H. pylori for 2 days with the last day being yesterday.  However, he stated he started his protonix only yesterday. He has not had any hematemesis.  Please note that his endoscopy was complicated by a period of unresponsiveness and stridor immediately following the procedure which was followed by apnea as documented in the nurse's notes. He briefly required Ambu bag assistance in PACU. No CPR. O2 saturation remained good and other vital signs were not lost. He rapidly responded to Romazicon, Narcan and racemic epinephrine (Versed 6 mg IV and Demerol 75 mg IV). By family report, his head neck cancer has not been completely treated surgically. He is slated to undergo laryngectomy in the [facility] later this month  Past Medical History   Diagnosis Date  . Hypertension   . Diabetes 10/12/2013  . Throat cancer 10/12/2013  . Hyperlipidemia 10/12/2013    Past Surgical History  Procedure Laterality Date  . Throat surgery  2013    remove cancer, [facility] [place]  . Colonoscopy      [facility] around 2007? polyps  . Esophagogastroduodenoscopy N/A 10/13/2013    Procedure: ESOPHAGOGASTRODUODENOSCOPY (EGD);  Surgeon: Danie Binder, MD;  Location: [facility];  Service: Endoscopy;  Laterality: N/A;    Prior to Admission medications   Medication Sig Start Date End Date Taking? Authorizing Provider  amoxicillin (AMOXIL) 500 MG capsule Take 500 mg by mouth 4 (four) times daily. 10/17/13  Yes Historical Provider, MD  clarithromycin (BIAXIN) 500 MG tablet Take 500 mg by mouth 2 (two) times daily. 10/17/13  Yes Historical Provider, MD  metFORMIN (GLUCOPHAGE) 500 MG tablet Take 500 mg by mouth daily.   Yes Historical Provider, MD  pantoprazole (PROTONIX) 40 MG tablet Take 1 tablet (40 mg total) by mouth 2 (two) times daily before a meal. 10/14/13  Yes Samuella Cota, MD  simvastatin (ZOCOR) 40 MG tablet Take 40 mg by mouth daily.   Yes Historical Provider, MD    Current Facility-Administered Medications  Medication Dose Route Frequency Provider Last Rate Last Dose  . 0.9 %  sodium chloride infusion   Intravenous Continuous Nimish C Gosrani, MD      . insulin aspart (novoLOG) injection 0-5 Units  0-5 Units Subcutaneous QHS Nimish Luther Parody, MD      . [  START ON 10/19/2013] insulin aspart (novoLOG) injection 0-9 Units  0-9 Units Subcutaneous TID WC Nimish C Gosrani, MD      . ondansetron (ZOFRAN) tablet 4 mg  4 mg Oral Q6H PRN Nimish Luther Parody, MD       Or  . ondansetron (ZOFRAN) injection 4 mg  4 mg Intravenous Q6H PRN Nimish C Gosrani, MD      . pantoprazole (PROTONIX) injection 40 mg  40 mg Intravenous Q12H Nimish C Gosrani, MD      . sodium chloride 0.9 % bolus 500 mL  500 mL Intravenous Once Sharyon Cable, MD        Allergies as of  10/18/2013  . (No Known Allergies)    Family History  Problem Relation Age of Onset  . Colon cancer Neg Hx     History   Social History  . Marital Status: Legally Separated    Spouse Name: N/A    Number of Children: 3  . Years of Education: N/A   Occupational History  . Not on file.   Social History Main Topics  . Smoking status: Former Research scientist (life sciences)  . Smokeless tobacco: Not on file  . Alcohol Use: No  . Drug Use: No  . Sexual Activity: Not on file   Other Topics Concern  . Not on file   Social History Narrative  . No narrative on file    Review of Systems: Gen: Denies any fever, chills, sweats, CV: Denies chest pain, angina, palpitations, claudication. Resp:  , coughing up blood, and pleurisy. GI: Denies vomiting blood, jaundice, and fecal incontinence.   Denies dysphagia or odynophagia. Heme: Denies bruising, bleeding, and enlarged lymph nodes.   Physical Exam: Vital signs in last 24 hours: Temp:  [97.6 F (36.4 C)] 97.6 F (36.4 C) (03/11 1641) Pulse Rate:  [101-103] 101 (03/11 1829) Resp:  [16-22] 17 (03/11 1936) BP: (112-122)/(60-68) 122/62 mmHg (03/11 1936) SpO2:  [100 %] 100 % (03/11 1829) Weight:  [162 lb (73.483 kg)-163 lb 2.3 oz (74 kg)] 163 lb 2.3 oz (74 kg) (03/11 1936)   General:   Alert,  pleasant conversant individual seen in ICU room 1 accompanied by multiple family members. He speaks in a low volume, raspy voice Head:  Normocephalic and atraumatic. Eyes:  Sclera clear, no icterus.   Conjunctiva pink. Ears:  Normal auditory acuity. Nose:  No deformity, discharge,  or lesions. Mouth:  No deformity or lesions, dentition normal. Neck:  Supple; no masses or thyromegaly. Lungs:  Clear throughout to auscultation.   No wheezes, crackles, or rhonchi. No acute distress. Heart:  Regular rate and rhythm; no murmurs, clicks, rubs,  or gallops. Abdomen:  Positive bowel sounds. Soft and nontender without appreciable mass or megaly  Rectal:  Deferred;  done  in ED.   Pulses:  Normal pulses noted. Extremities:  Without clubbing or edema.  Lab Results:  Recent Labs  10/18/13 1653  WBC 7.1  HGB 6.2*  HCT 18.7*  PLT 172   BMET  Recent Labs  10/18/13 1653  NA 133*  K 4.8  CL 96  CO2 29  GLUCOSE 205*  BUN 18  CREATININE 1.08  CALCIUM 8.2*   Studies/Results: Dg Chest Portable 1 View  10/18/2013   CLINICAL DATA Shortness of breath.  History throat cancer.  EXAM PORTABLE CHEST - 1 VIEW  COMPARISON None.  FINDINGS Mild prominence of the mediastinum is noted. This may be from prominent great vessels however mass lesion or adenopathy cannot be  excluded. Lungs are clear of acute infiltrates. Left apical pleural thickening is noted. Pleural based left apical pleural or pulmonary mass cannot be excluded. Adjacent surgical clips noted over the left apex. No pleural effusion or pneumothorax. No focal osseous abnormality. Heart size and pulmonary vascularity normal.  IMPRESSION 1. Left apical pleural-based density. CT of the chest suggested for further evaluation. 2. Mild prominence of the superior mediastinum, most likely vascular, superior mediastinal mass cannot be completely excluded and CT would be useful for further evaluation of this region as well. 3. No acute cardiopulmonary disease.  No focal pulmonary infiltrate.  SIGNATURE  Electronically Signed   By: Marcello Moores  Register   On: 10/18/2013 17:37    Impression:  Very pleasant 68 year old African [place] male with head and neck cancer, Recent upper GI bleed secondary to duodenal bulbar ulcer in the setting of NSAID use and documented H. pylori infection now readmitted with recurrent bleeding.  His hemoglobin is down a good 2 g from the value at recent hospital discharge. He has a significant anemia.  He will need additional blood/fluid resuscitation along with IV acid suppression therapy and an eventual EGD in the next 12-24 hours.  He had a briefly complicated course following his EGD last week.  It sounds as though he may have had laryngospasm in combination with sedation issues. Known head and neck cancer. Laryngectomy has been planned in the very near future.  Recommendations: Continued IV blood/fluid resuscitation. 2 units of packed blood cells have been ordered. IV Protonix  bolus followed by intravenous infusion.   He needs to complete an un-interrupted course of triple drug therapy for H. pylori in the very near future.  Will out nothing by mouth other than a few sips of clear liquids  Would like to perform his EGD tomorrow with the assistance of anesthesia given respiratory issues which occurred last week with his prior procedure. I briefly discussed with Ferdinand Lango CRNA this evening; Will discuss further with anesthesia in the morning. Further evaluation of abnormal chest x-ray findings per attending.

## 2013-10-19 ENCOUNTER — Encounter (HOSPITAL_COMMUNITY)
Admission: EM | Disposition: A | Discharge status: Home / Self Care | Payer: Self-pay | Source: Home / Self Care | Attending: Internal Medicine

## 2013-10-19 DIAGNOSIS — C140 Malignant neoplasm of pharynx, unspecified: Secondary | ICD-10-CM

## 2013-10-19 DIAGNOSIS — K922 Gastrointestinal hemorrhage, unspecified: Secondary | ICD-10-CM

## 2013-10-19 DIAGNOSIS — K264 Chronic or unspecified duodenal ulcer with hemorrhage: Secondary | ICD-10-CM

## 2013-10-19 LAB — PROTIME-INR
INR: 1.2 (ref 0.00–1.49)
Prothrombin Time: 14.9 seconds (ref 11.6–15.2)

## 2013-10-19 LAB — CBC
HEMATOCRIT: 22.4 % — AB (ref 39.0–52.0)
Hemoglobin: 7.8 g/dL — ABNORMAL LOW (ref 13.0–17.0)
MCH: 29.4 pg (ref 26.0–34.0)
MCHC: 34.8 g/dL (ref 30.0–36.0)
MCV: 84.5 fL (ref 78.0–100.0)
Platelets: 120 10*3/uL — ABNORMAL LOW (ref 150–400)
RBC: 2.65 MIL/uL — ABNORMAL LOW (ref 4.22–5.81)
RDW: 15.6 % — ABNORMAL HIGH (ref 11.5–15.5)
WBC: 4 10*3/uL (ref 4.0–10.5)

## 2013-10-19 LAB — GLUCOSE, CAPILLARY
GLUCOSE-CAPILLARY: 83 mg/dL (ref 70–99)
Glucose-Capillary: 66 mg/dL — ABNORMAL LOW (ref 70–99)
Glucose-Capillary: 79 mg/dL (ref 70–99)
Glucose-Capillary: 84 mg/dL (ref 70–99)
Glucose-Capillary: 86 mg/dL (ref 70–99)
Glucose-Capillary: 95 mg/dL (ref 70–99)

## 2013-10-19 LAB — COMPREHENSIVE METABOLIC PANEL
ALBUMIN: 2.7 g/dL — AB (ref 3.5–5.2)
ALT: 13 U/L (ref 0–53)
AST: 13 U/L (ref 0–37)
Alkaline Phosphatase: 35 U/L — ABNORMAL LOW (ref 39–117)
BUN: 13 mg/dL (ref 6–23)
CALCIUM: 8.8 mg/dL (ref 8.4–10.5)
CO2: 30 mEq/L (ref 19–32)
Chloride: 99 mEq/L (ref 96–112)
Creatinine, Ser: 1.06 mg/dL (ref 0.50–1.35)
GFR calc Af Amer: 82 mL/min — ABNORMAL LOW (ref >90)
GFR calc non Af Amer: 71 mL/min — ABNORMAL LOW (ref >90)
Glucose, Bld: 85 mg/dL (ref 70–99)
Potassium: 5 mEq/L (ref 3.7–5.3)
Sodium: 136 mEq/L — ABNORMAL LOW (ref 137–147)
TOTAL PROTEIN: 5.6 g/dL — AB (ref 6.0–8.3)
Total Bilirubin: 1.3 mg/dL — ABNORMAL HIGH (ref 0.3–1.2)

## 2013-10-19 LAB — HEMOGLOBIN AND HEMATOCRIT, BLOOD
HCT: 27.3 % — ABNORMAL LOW (ref 39.0–52.0)
HEMATOCRIT: 25.2 % — AB (ref 39.0–52.0)
HEMOGLOBIN: 8.5 g/dL — AB (ref 13.0–17.0)
Hemoglobin: 9.3 g/dL — ABNORMAL LOW (ref 13.0–17.0)

## 2013-10-19 LAB — PREPARE RBC (CROSSMATCH)

## 2013-10-19 LAB — APTT: aPTT: 32 seconds (ref 24–37)

## 2013-10-19 SURGERY — EGD (ESOPHAGOGASTRODUODENOSCOPY)
Anesthesia: Monitor Anesthesia Care

## 2013-10-19 MED ORDER — DIPHENHYDRAMINE HCL 25 MG PO CAPS: 25.0000 mg | ORAL_CAPSULE | Freq: Once | ORAL | Status: DC

## 2013-10-19 MED ORDER — ACETAMINOPHEN 325 MG PO TABS: 650.0000 mg | ORAL_TABLET | Freq: Once | ORAL | Status: DC

## 2013-10-19 MED ORDER — INSULIN ASPART 100 UNIT/ML ~~LOC~~ SOLN
0.0000 [IU] | SUBCUTANEOUS | Status: DC
  Administered 2013-10-21 – 2013-10-22 (×3): 1 [IU] via SUBCUTANEOUS

## 2013-10-19 MED ORDER — SODIUM CHLORIDE 0.9 % IV SOLN
INTRAVENOUS | Status: DC
  Administered 2013-10-20: 500 mL via INTRAVENOUS

## 2013-10-19 MED ORDER — ACETAMINOPHEN 325 MG PO TABS
650.0000 mg | ORAL_TABLET | Freq: Once | ORAL | Status: AC
  Administered 2013-10-19: 650 mg via ORAL
  Filled 2013-10-19: qty 2

## 2013-10-19 NOTE — Progress Notes (Signed)
Patient transferred to [facility] via [facility]. Report given to [facility] staff and [place], Levada Dy. Vital signs stable at transfer.

## 2013-10-19 NOTE — Progress Notes (Signed)
INITIAL NUTRITION ASSESSMENT  DOCUMENTATION CODES Per approved criteria  -Severe malnutrition in the context of chronic illness   Pt meets criteria for severe MALNUTRITION in the context of chronic illness as evidenced by <75% of estimated nutritional needs x 1 month, 13% wt loss x 2 months.  INTERVENTION: Resource Breeze po TID, each supplement provides 250 kcal and 9 grams of protein Follow for diet advancement  NUTRITION DIAGNOSIS: Inadequate oral intake related to early satiety, altered GI function as evidenced by hx poor po's, 13% wt loss x 2 months.   Goal: Pt will meet >90% of estimated nutritional needs  Monitor:  Diet advancement, PO intake, labs, skin assessments, weight changes, I/O's  Reason for Assessment: MST=3  68 y.o. male  Admitting Dx: <principal problem not specified>  ASSESSMENT: Pt admitted with GI bleed. Plan is for pt to transfer to [facility] due to medical complexity. Pt at high risk for airway complications and MD feels that any procedures would be better managed at a tertiary care center.  Pt with hx of throat cancer s/p radiation and lymph node dissection. Per H&P, pt awaiting further throat surgery at [facility].  Pt reports decreased appetite and early satiety. He states that he has lost approximately 20-25# in the last 2 months (13% wt loss, which is clinically significant). He reports that he recently bought Ensure and has started drinking at home, although he has not been consistent about this. He did express interest in continuing supplements due to hx of poor appetite and weight loss.  Unable to perform physical exam, as [facility] arrived during visit.   Height: Ht Readings from Last 1 Encounters:  10/18/13 6\' 5"  (1.956 m)    Weight: Wt Readings from Last 1 Encounters:  10/19/13 163 lb 2.3 oz (74 kg)    Ideal Body Weight: 208#  % Ideal Body Weight: 78%  Wt Readings from Last 10 Encounters:  10/19/13 163 lb 2.3 oz (74 kg)  10/19/13 163 lb 2.3 oz (74  kg)  10/12/13 166 lb 6.4 oz (75.479 kg)  10/12/13 166 lb 6.4 oz (75.479 kg)    Usual Body Weight: 191#  % Usual Body Weight: 84%  BMI:  Body mass index is 19.34 kg/(m^2). Meets criteria for normal weight  Estimated Nutritional Needs: Kcal: 2223-2593 daily Protein: 74-93 grams daily Fluid: 2.2-2.6 L daily  Skin: Intact  Diet Order: Clear Liquid  EDUCATION NEEDS: -Education needs addressed   Intake/Output Summary (Last 24 hours) at 10/19/13 1938 Last data filed at 10/19/13 1900  Gross per 24 hour  Intake 3262.91 ml  Output   1615 ml  Net 1647.91 ml    Last BM: 10/19/13   Labs:   Recent Labs Lab 10/13/13 0624 10/18/13 1653 10/19/13 0710  NA 138 133* 136*  K 4.2 4.8 5.0  CL 100 96 99  CO2 30 29 30   BUN 14 18 13   CREATININE 1.03 1.08 1.06  CALCIUM 9.2 8.2* 8.8  GLUCOSE 92 205* 85    CBG (last 3)   Recent Labs  10/19/13 0746 10/19/13 1110 10/19/13 1654  GLUCAP 83 84 66*    Scheduled Meds: . acetaminophen  650 mg Oral Once  . diphenhydrAMINE  25 mg Oral Once  . insulin aspart  0-9 Units Subcutaneous 6 times per day  . sodium chloride  500 mL Intravenous Once    Continuous Infusions: . sodium chloride 100 mL/hr at 10/19/13 1712  . pantoprozole (PROTONIX) infusion 8 mg/hr (10/19/13 3244)    Past Medical  History  Diagnosis Date  . Hypertension   . Diabetes 10/12/2013  . Throat cancer 10/12/2013  . Hyperlipidemia 10/12/2013    Past Surgical History  Procedure Laterality Date  . Throat surgery  2013    remove cancer, [facility] [place]  . Colonoscopy      [facility] around 2007? polyps  . Esophagogastroduodenoscopy N/A 10/13/2013    Procedure: ESOPHAGOGASTRODUODENOSCOPY (EGD);  Surgeon: Danie Binder, MD;  Location: [facility];  Service: Endoscopy;  Laterality: N/A;    Shanese Riemenschneider A. Jimmye Norman, RD, LDN Pager: [phone]

## 2013-10-19 NOTE — Progress Notes (Signed)
Patient stable overnight; hemoglobin this morning 7.8 after 2 units. Discussed at length with Dr. Patsey Berthold. He feels patient is at extremely high risk for airway complications for any of procedure even with endotracheal intubation. Does not feel patient should have another EGD or other procedure here. Recommends transfer to a tertiary center. Will arrange for him to receive another unit of packed red blood cells.  Transfer per attending.

## 2013-10-19 NOTE — ED Provider Notes (Signed)
CSN: [id number]     Arrival date & time 10/12/13  1829 History   First MD Initiated Contact with Patient 10/12/13 1844     Chief Complaint  Patient presents with  . Anemia     (Consider location/radiation/quality/duration/timing/severity/associated sxs/prior Treatment) Patient is a 68 y.o. male presenting with anemia. The history is provided by the patient.  Anemia This is a new problem. Pertinent negatives include no chest pain, no headaches and no shortness of breath.  patient presented with a hemoglobin of 5.6 on a lab test done at the [place]. He has been fatigued for a while now. He has had black stools. No fevers. No abdominal pain. He has throat cancer and is due to have is voicebox out.     Past Medical History  Diagnosis Date  . Hypertension   . Diabetes 10/12/2013  . Throat cancer 10/12/2013  . Hyperlipidemia 10/12/2013   Past Surgical History  Procedure Laterality Date  . Throat surgery  2013    remove cancer, [facility] [place]  . Colonoscopy      [facility] around 2007? polyps  . Esophagogastroduodenoscopy N/A 10/13/2013    Procedure: ESOPHAGOGASTRODUODENOSCOPY (EGD);  Surgeon: Danie Binder, MD;  Location: [facility];  Service: Endoscopy;  Laterality: N/A;   Family History  Problem Relation Age of Onset  . Colon cancer Neg Hx    History  Substance Use Topics  . Smoking status: Former Research scientist (life sciences)  . Smokeless tobacco: Not on file  . Alcohol Use: No    Review of Systems  Constitutional: Negative for activity change and appetite change.  Eyes: Negative for pain.  Respiratory: Negative for chest tightness and shortness of breath.   Cardiovascular: Negative for chest pain and leg swelling.  Gastrointestinal: Positive for blood in stool. Negative for nausea, vomiting and diarrhea.  Genitourinary: Negative for flank pain.  Musculoskeletal: Negative for back pain and neck stiffness.  Skin: Negative for rash.  Neurological: Negative for weakness, numbness and headaches.   Psychiatric/Behavioral: Negative for behavioral problems.      Allergies  Review of patient's allergies indicates no known allergies.  Home Medications  No current outpatient prescriptions on file. BP 138/78  Pulse 96  Temp(Src) 98 F (36.7 C) (Oral)  Resp 16  Ht 6\' 4"  (1.93 m)  Wt 166 lb 6.4 oz (75.479 kg)  BMI 20.26 kg/m2  SpO2 100% Physical Exam  Nursing note and vitals reviewed. Constitutional: He is oriented to person, place, and time. He appears well-developed and well-nourished.  HENT:  Head: Normocephalic and atraumatic.  Eyes: EOM are normal. Pupils are equal, round, and reactive to light.  Neck: Normal range of motion.  Deformity to left neck and shoulder.   Cardiovascular: Normal rate, regular rhythm and normal heart sounds.   No murmur heard. Pulmonary/Chest: Effort normal and breath sounds normal.  Abdominal: Soft. Bowel sounds are normal. He exhibits no distension and no mass. There is no tenderness. There is no rebound and no guarding.  Musculoskeletal: Normal range of motion. He exhibits no edema.  Neurological: He is alert and oriented to person, place, and time. No cranial nerve deficit.  Skin: Skin is warm and dry. There is pallor.  Psychiatric: He has a normal mood and affect.    ED Course  Procedures (including critical care time) Labs Review Labs Reviewed  CBC WITH DIFFERENTIAL - Abnormal; Notable for the following:    RBC 2.07 (*)    Hemoglobin 5.7 (*)    HCT 17.0 (*)  RDW 15.8 (*)    Neutrophils Relative % 84 (*)    Lymphocytes Relative 9 (*)    Lymphs Abs 0.5 (*)    All other components within normal limits  IRON AND TIBC - Abnormal; Notable for the following:    Iron 40 (*)    Saturation Ratios 15 (*)    All other components within normal limits  RETICULOCYTES - Abnormal; Notable for the following:    Retic Ct Pct 5.8 (*)    RBC. 2.07 (*)    All other components within normal limits  COMPREHENSIVE METABOLIC PANEL - Abnormal;  Notable for the following:    CO2 33 (*)    Glucose, Bld 140 (*)    Albumin 3.3 (*)    Total Bilirubin 0.2 (*)    GFR calc non Af Amer 58 (*)    GFR calc Af Amer 67 (*)    All other components within normal limits  COMPREHENSIVE METABOLIC PANEL - Abnormal; Notable for the following:    Albumin 3.0 (*)    GFR calc non Af Amer 73 (*)    GFR calc Af Amer 85 (*)    All other components within normal limits  CBC - Abnormal; Notable for the following:    RBC 2.85 (*)    Hemoglobin 8.1 (*)    HCT 23.9 (*)    RDW 15.6 (*)    Platelets 130 (*)    All other components within normal limits  GLUCOSE, CAPILLARY - Abnormal; Notable for the following:    Glucose-Capillary 103 (*)    All other components within normal limits  GLUCOSE, CAPILLARY - Abnormal; Notable for the following:    Glucose-Capillary 113 (*)    All other components within normal limits  CBC - Abnormal; Notable for the following:    RBC 2.85 (*)    Hemoglobin 8.2 (*)    HCT 23.9 (*)    Platelets 117 (*)    All other components within normal limits  GLUCOSE, CAPILLARY - Abnormal; Notable for the following:    Glucose-Capillary 103 (*)    All other components within normal limits  VITAMIN B12  FOLATE  FERRITIN  PROTIME-INR  APTT  GLUCOSE, CAPILLARY  GLUCOSE, CAPILLARY  GLUCOSE, CAPILLARY  CBG MONITORING, ED  TYPE AND SCREEN  PREPARE RBC (CROSSMATCH)  ABO/RH  SURGICAL PATHOLOGY   Imaging Review Dg Chest Portable 1 View  10/18/2013   CLINICAL DATA Shortness of breath.  History throat cancer.  EXAM PORTABLE CHEST - 1 VIEW  COMPARISON None.  FINDINGS Mild prominence of the mediastinum is noted. This may be from prominent great vessels however mass lesion or adenopathy cannot be excluded. Lungs are clear of acute infiltrates. Left apical pleural thickening is noted. Pleural based left apical pleural or pulmonary mass cannot be excluded. Adjacent surgical clips noted over the left apex. No pleural effusion or  pneumothorax. No focal osseous abnormality. Heart size and pulmonary vascularity normal.  IMPRESSION 1. Left apical pleural-based density. CT of the chest suggested for further evaluation. 2. Mild prominence of the superior mediastinum, most likely vascular, superior mediastinal mass cannot be completely excluded and CT would be useful for further evaluation of this region as well. 3. No acute cardiopulmonary disease.  No focal pulmonary infiltrate.  SIGNATURE  Electronically Signed   By: Maisie Fus  Register   On: 10/18/2013 17:37     EKG Interpretation None      MDM   Final diagnoses:  Anemia  GI bleed  Patient with GI bleed and symptomatic anemia. Will admit to internal medicine    Jasper Riling. Alvino Chapel, MD 10/19/13 1027

## 2013-10-19 NOTE — Telephone Encounter (Signed)
Reminder in epic °

## 2013-10-19 NOTE — Care Management Note (Signed)
UR completed 

## 2013-10-19 NOTE — Progress Notes (Signed)
Received from [facility] per [facility] without any distress noted. MD made aware of arrival.

## 2013-10-19 NOTE — Progress Notes (Signed)
[facility] PROGRESS NOTE  LEV CERVONE TIW:[id number] DOB: 16-Feb-1946 DOA: 10/18/2013 PCP: No primary provider on file.  Assessment/Plan: 1. Acute GI bleeding. Patient has had repeated bloody stools which began yesterday. He was recently admitted approximately one week ago for similar complaints. He had EGD done at that time which revealed a duodenal ulcer, without any signs of active bleeding. The patient was placed on proton pump inhibitors and was asked to avoid any nonsteroidal anti-inflammatories. He has recurrent GI bleeding which has led to this admission. Hemoglobin on admission was 6.2. He was started on Protonix infusion. He was seen by gastroenterology and felt that repeat EGD would be needed. It was felt that this procedure would need to be done under anesthesia, since the patient had significant stridor and briefly needed Ambu bag during his prior EGD. This case was discussed by gastroenterology with anesthesiology, and with patient's history of throat cancer, he would be at extremely high-risk for airway complications for any procedure even with endotracheal intubation. It was not felt the patient should have another EGD or other procedures done at [facility]. Recommendations were for transfer to [facility] for further care. I discussed this with the patient and his family, who agree. I have also discussed case with Dr. Wynelle Cleveland on at [facility] who has agreed to accept the patient in transfer. Gastroenterology will need to be consulted on the patient's arrival. He is currently n.p.o. and has Protonix infusion. He's been transfused total of 3 units of PRBCs. 2.  Acute blood loss anemia. Status post transfusions of PRBCs. Continue to monitor hemoglobin. 3. Throat cancer. Patient is set up for surgical management at the [place] in the coming weeks. Patient also has an abnormal chest x-ray and with his history of cancer, would likely benefit from CT chest. Although this can be  done non-emergently. 4. Hypotension. Likely related to GI bleeding. Resolved. 5. Diabetes. Continue to hold metformin. He is on sliding-scale insulin.  Code Status: full code Family Communication: discussed with patient and daughter at the bedside Disposition Plan: transfer to [facility] for further care.   Consultants:  Gastroenterology  Procedures:    Antibiotics:    HPI/Subjective: Had 1 bloody bowel movement a short while ago, feels some abdominal cramps.  Objective: Filed Vitals:   10/19/13 0630  BP: 141/85  Pulse:   Temp:   Resp: 18    Intake/Output Summary (Last 24 hours) at 10/19/13 1034 Last data filed at 10/19/13 0934  Gross per 24 hour  Intake 1625.41 ml  Output   1375 ml  Net 250.41 ml   Filed Weights   10/18/13 1641 10/18/13 1936 10/19/13 0500  Weight: 73.483 kg (162 lb) 74 kg (163 lb 2.3 oz) 74 kg (163 lb 2.3 oz)    Exam:   General:  NAD, cachetic, hoarse voice  Cardiovascular: S1, S2 RRR  Respiratory: CTA B  Abdomen: soft, nt, nd, bs+  Musculoskeletal: no edema b/l   Data Reviewed: Basic Metabolic Panel:  Recent Labs Lab 10/12/13 1900 10/13/13 0624 10/18/13 1653 10/19/13 0710  NA 139 138 133* 136*  K 4.6 4.2 4.8 5.0  CL 97 100 96 99  CO2 33* 30 29 30   GLUCOSE 140* 92 205* 85  BUN 21 14 18 13   CREATININE 1.25 1.03 1.08 1.06  CALCIUM 9.4 9.2 8.2* 8.8   Liver Function Tests:  Recent Labs Lab 10/12/13 1900 10/13/13 0624 10/19/13 0710  AST 22 16 13   ALT 16 14  13  ALKPHOS 53 40 35*  BILITOT 0.2* 0.5 1.3*  PROT 6.7 6.3 5.6*  ALBUMIN 3.3* 3.0* 2.7*   No results found for this basename: LIPASE, AMYLASE,  in the last 168 hours No results found for this basename: AMMONIA,  in the last 168 hours CBC:  Recent Labs Lab 10/12/13 1900 10/13/13 0624 10/14/13 0546 10/18/13 1653 10/19/13 0710  WBC 5.4 4.2 5.7 7.1 4.0  NEUTROABS 4.5  --   --  6.4  --   HGB 5.7* 8.1* 8.2* 6.2* 7.8*  HCT 17.0* 23.9* 23.9* 18.7*  22.4*  MCV 82.1 83.9 83.9 83.9 84.5  PLT 180 130* 117* 172 120*   Cardiac Enzymes:  Recent Labs Lab 10/18/13 1653  TROPONINI <0.30   BNP (last 3 results) No results found for this basename: PROBNP,  in the last 8760 hours CBG:  Recent Labs Lab 10/18/13 1910 10/18/13 2032 10/18/13 2334 10/19/13 0358 10/19/13 0746  GLUCAP 102* 95 97 86 83    Recent Results (from the past 240 hour(s))  MRSA PCR SCREENING     Status: None   Collection Time    10/18/13  7:43 PM      Result Value Ref Range Status   MRSA by PCR NEGATIVE  NEGATIVE Final   Comment:            The GeneXpert MRSA Assay (FDA     approved for NASAL specimens     only), is one component of a     comprehensive MRSA colonization     surveillance program. It is not     intended to diagnose MRSA     infection nor to guide or     monitor treatment for     MRSA infections.     Studies: Dg Chest Portable 1 View  10/18/2013   CLINICAL DATA Shortness of breath.  History throat cancer.  EXAM PORTABLE CHEST - 1 VIEW  COMPARISON None.  FINDINGS Mild prominence of the mediastinum is noted. This may be from prominent great vessels however mass lesion or adenopathy cannot be excluded. Lungs are clear of acute infiltrates. Left apical pleural thickening is noted. Pleural based left apical pleural or pulmonary mass cannot be excluded. Adjacent surgical clips noted over the left apex. No pleural effusion or pneumothorax. No focal osseous abnormality. Heart size and pulmonary vascularity normal.  IMPRESSION 1. Left apical pleural-based density. CT of the chest suggested for further evaluation. 2. Mild prominence of the superior mediastinum, most likely vascular, superior mediastinal mass cannot be completely excluded and CT would be useful for further evaluation of this region as well. 3. No acute cardiopulmonary disease.  No focal pulmonary infiltrate.  SIGNATURE  Electronically Signed   By: [place]   On: 10/18/2013 17:37     Scheduled Meds: . acetaminophen  650 mg Oral Once  . diphenhydrAMINE  25 mg Oral Once  . insulin aspart  0-9 Units Subcutaneous 6 times per day  . sodium chloride  500 mL Intravenous Once   Continuous Infusions: . sodium chloride 100 mL/hr at 10/18/13 2310  . pantoprozole (PROTONIX) infusion 8 mg/hr (10/19/13 2951)    Active Problems:   Throat cancer   Diabetes   Acute blood loss anemia   GI bleed    Time spent: 69mins    Mitra Duling  [facility] Pager [phone]. If 7PM-7AM, please contact night-coverage at [email or web address], password [facility] 10/19/2013, 10:34 AM  LOS: 1 day

## 2013-10-19 NOTE — Progress Notes (Addendum)
Subjective:  Patient with no BM overnight. No complaints.   Objective: Vital signs in last 24 hours: Temp:  [97.6 F (36.4 C)-99.2 F (37.3 C)] 98.8 F (37.1 C) (03/12 0600) Pulse Rate:  [86-109] 91 (03/12 0600) Resp:  [13-25] 18 (03/12 0630) BP: (109-146)/(55-86) 141/85 mmHg (03/12 0630) SpO2:  [98 %-100 %] 98 % (03/11 1900) Weight:  [162 lb (73.483 kg)-163 lb 2.3 oz (74 kg)] 163 lb 2.3 oz (74 kg) (03/12 0500)   General:   Alert,  Well-developed, well-nourished, pleasant and cooperative in NAD Head:  Normocephalic and atraumatic. Eyes:  Sclera clear, no icterus.  Chest: CTA bilaterally without rales, rhonchi, crackles.    Heart:  Regular rate and rhythm; no murmurs, clicks, rubs,  or gallops. Abdomen:  Soft, nontender and nondistended. No masses, hepatosplenomegaly or hernias noted. Normal bowel sounds, without guarding, and without rebound.   Extremities:  Without clubbing, deformity or edema. Neurologic:  Alert and  oriented x4;  grossly normal neurologically. Skin:  Intact without significant lesions or rashes. Psych:  Alert and cooperative. Normal mood and affect.  Intake/Output from previous day: 03/11 0701 - 03/12 0700 In: 1625.4 [I.V.:848.3; Blood:777.1] Out: 800 [Urine:800] Intake/Output this shift:    Lab Results: CBC  Recent Labs  10/18/13 1653 10/19/13 0710  WBC 7.1 4.0  HGB 6.2* 7.8*  HCT 18.7* 22.4*  MCV 83.9 84.5  PLT 172 120*   BMET  Recent Labs  10/18/13 1653 10/19/13 0710  NA 133* 136*  K 4.8 5.0  CL 96 99  CO2 29 30  GLUCOSE 205* 85  BUN 18 13  CREATININE 1.08 1.06  CALCIUM 8.2* 8.8   LFTs  Recent Labs  10/19/13 0710  BILITOT 1.3*  ALKPHOS 35*  AST 13  ALT 13  PROT 5.6*  ALBUMIN 2.7*   No results found for this basename: LIPASE,  in the last 72 hours PT/INR  Recent Labs  10/19/13 0710  LABPROT 14.9  INR 1.20      Imaging Studies: Dg Chest Portable 1 View  10/18/2013   CLINICAL DATA Shortness of breath.  History  throat cancer.  EXAM PORTABLE CHEST - 1 VIEW  COMPARISON None.  FINDINGS Mild prominence of the mediastinum is noted. This may be from prominent great vessels however mass lesion or adenopathy cannot be excluded. Lungs are clear of acute infiltrates. Left apical pleural thickening is noted. Pleural based left apical pleural or pulmonary mass cannot be excluded. Adjacent surgical clips noted over the left apex. No pleural effusion or pneumothorax. No focal osseous abnormality. Heart size and pulmonary vascularity normal.  IMPRESSION 1. Left apical pleural-based density. CT of the chest suggested for further evaluation. 2. Mild prominence of the superior mediastinum, most likely vascular, superior mediastinal mass cannot be completely excluded and CT would be useful for further evaluation of this region as well. 3. No acute cardiopulmonary disease.  No focal pulmonary infiltrate.  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 pylori infection now readmitted with recurrent bleeding. His hemoglobin is down a good 2 g from the value at recent hospital discharge. He has a significant anemia. He received 2 units last night. Hgb 7.8 this morning. No BM overnight.  VSS.   Discussed with Dr. Gala Romney this morning.  Dr. Patsey Berthold is recommending patient be transferred to [facility] or [facility] for EGD. 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 Patient cannot have EGD here at [facility] for this reasons.    Plan: 1. One more unit prbcs now. 2. Continue IV Protonix drip. 3. Discussed need of transfer with the patient. He voiced understanding and prefers to go to [facility]. I have also spoken  with Dr. Roderic Palau who will make arrangements for transfer.    LOS: 1 day   Neil Crouch  10/19/2013, 8:28 AM   Attending note:  Agree  Reviewed transfer plan with family members afternoon. Everyone is in agreement.

## 2013-10-20 ENCOUNTER — Encounter (HOSPITAL_COMMUNITY)
Admission: EM | Disposition: A | Discharge status: Home / Self Care | Payer: Self-pay | Source: Home / Self Care | Attending: Internal Medicine

## 2013-10-20 ENCOUNTER — Inpatient Hospital Stay (HOSPITAL_COMMUNITY): Payer: Medicare HMO | Admitting: Certified Registered"

## 2013-10-20 ENCOUNTER — Encounter (HOSPITAL_COMMUNITY): Payer: Medicare HMO | Admitting: Certified Registered"

## 2013-10-20 ENCOUNTER — Encounter (HOSPITAL_COMMUNITY): Payer: Self-pay | Admitting: Certified Registered"

## 2013-10-20 DIAGNOSIS — K264 Chronic or unspecified duodenal ulcer with hemorrhage: Principal | ICD-10-CM

## 2013-10-20 DIAGNOSIS — K222 Esophageal obstruction: Secondary | ICD-10-CM

## 2013-10-20 DIAGNOSIS — E43 Unspecified severe protein-calorie malnutrition: Secondary | ICD-10-CM | POA: Insufficient documentation

## 2013-10-20 HISTORY — PX: ESOPHAGOGASTRODUODENOSCOPY: SHX5428

## 2013-10-20 LAB — GLUCOSE, CAPILLARY
GLUCOSE-CAPILLARY: 86 mg/dL (ref 70–99)
GLUCOSE-CAPILLARY: 88 mg/dL (ref 70–99)
GLUCOSE-CAPILLARY: 79 mg/dL (ref 70–99)
Glucose-Capillary: 89 mg/dL (ref 70–99)
Glucose-Capillary: 107 mg/dL — ABNORMAL HIGH (ref 70–99)

## 2013-10-20 LAB — BASIC METABOLIC PANEL
BUN: 14 mg/dL (ref 6–23)
CO2: 27 mEq/L (ref 19–32)
Calcium: 9.1 mg/dL (ref 8.4–10.5)
Chloride: 96 mEq/L (ref 96–112)
Creatinine, Ser: 0.97 mg/dL (ref 0.50–1.35)
GFR, EST NON AFRICAN AMERICAN: 84 mL/min — AB (ref >90)
Glucose, Bld: 92 mg/dL (ref 70–99)
POTASSIUM: 4.3 meq/L (ref 3.7–5.3)
Sodium: 135 mEq/L — ABNORMAL LOW (ref 137–147)

## 2013-10-20 LAB — CBC
HCT: 26.2 % — ABNORMAL LOW (ref 39.0–52.0)
Hemoglobin: 9 g/dL — ABNORMAL LOW (ref 13.0–17.0)
MCH: 28.8 pg (ref 26.0–34.0)
MCHC: 34.4 g/dL (ref 30.0–36.0)
MCV: 83.7 fL (ref 78.0–100.0)
Platelets: 160 10*3/uL (ref 150–400)
RBC: 3.13 MIL/uL — ABNORMAL LOW (ref 4.22–5.81)
RDW: 15.3 % (ref 11.5–15.5)
WBC: 6.2 10*3/uL (ref 4.0–10.5)

## 2013-10-20 LAB — HEMOGLOBIN AND HEMATOCRIT, BLOOD
HEMATOCRIT: 26 % — AB (ref 39.0–52.0)
HEMOGLOBIN: 8.9 g/dL — AB (ref 13.0–17.0)

## 2013-10-20 SURGERY — EGD (ESOPHAGOGASTRODUODENOSCOPY)
Anesthesia: General

## 2013-10-20 MED ORDER — PEG-KCL-NACL-NASULF-NA ASC-C 100 G PO SOLR
0.5000 | Freq: Once | ORAL | Status: AC
  Administered 2013-10-21: 100 g

## 2013-10-20 MED ORDER — PROPOFOL 10 MG/ML IV BOLUS
INTRAVENOUS | Status: DC | PRN
  Administered 2013-10-20: 120 mg via INTRAVENOUS

## 2013-10-20 MED ORDER — PEG-KCL-NACL-NASULF-NA ASC-C 100 G PO SOLR: 1.0000 | Freq: Once | ORAL | Status: DC

## 2013-10-20 MED ORDER — SODIUM CHLORIDE 0.9 % IV SOLN
INTRAVENOUS | Status: DC | PRN
  Administered 2013-10-20: 12:00:00 via INTRAVENOUS

## 2013-10-20 MED ORDER — ONDANSETRON HCL 4 MG/2ML IJ SOLN
INTRAMUSCULAR | Status: DC | PRN
  Administered 2013-10-20: 4 mg via INTRAVENOUS

## 2013-10-20 MED ORDER — PEG-KCL-NACL-NASULF-NA ASC-C 100 G PO SOLR
0.5000 | Freq: Once | ORAL | Status: AC
  Administered 2013-10-20: 100 g
  Filled 2013-10-20: qty 1

## 2013-10-20 MED ORDER — SODIUM CHLORIDE 0.9 % IV SOLN: INTRAVENOUS | Status: DC

## 2013-10-20 MED ORDER — LIDOCAINE HCL (CARDIAC) 20 MG/ML IV SOLN
INTRAVENOUS | Status: DC | PRN
  Administered 2013-10-20: 80 mg via INTRAVENOUS

## 2013-10-20 MED ORDER — FENTANYL CITRATE 0.05 MG/ML IJ SOLN
INTRAMUSCULAR | Status: DC | PRN
  Administered 2013-10-20: 100 ug via INTRAVENOUS

## 2013-10-20 MED ORDER — SUCCINYLCHOLINE CHLORIDE 20 MG/ML IJ SOLN
INTRAMUSCULAR | Status: DC | PRN
  Administered 2013-10-20: 100 mg via INTRAVENOUS

## 2013-10-20 MED ORDER — PHENYLEPHRINE HCL 10 MG/ML IJ SOLN
INTRAMUSCULAR | Status: DC | PRN
  Administered 2013-10-20 (×2): 80 ug via INTRAVENOUS

## 2013-10-20 NOTE — Preoperative (Signed)
Beta Blockers   Reason not to administer Beta Blockers:Not Applicable 

## 2013-10-20 NOTE — Anesthesia Procedure Notes (Signed)
Procedure Name: Intubation Date/Time: 10/20/2013 12:27 PM Performed by: Melina Copa, Danya Spearman R Pre-anesthesia Checklist: Patient identified, Emergency Drugs available, Suction available, Patient being monitored and Timeout performed Patient Re-evaluated:Patient Re-evaluated prior to inductionOxygen Delivery Method: Circle system utilized Preoxygenation: Pre-oxygenation with 100% oxygen Intubation Type: IV induction Ventilation: Mask ventilation without difficulty Laryngoscope Size: Mac and 4 Grade View: Grade II Tube type: Oral Tube size: 7.5 mm Number of attempts: 1 Airway Equipment and Method: Stylet Placement Confirmation: ETT inserted through vocal cords under direct vision,  positive ETCO2 and breath sounds checked- equal and bilateral Secured at: 23 cm Tube secured with: Tape Dental Injury: Teeth and Oropharynx as per pre-operative assessment

## 2013-10-20 NOTE — Consult Note (Signed)
[facility] Consult: 8:57 AM 10/20/2013  LOS: 2 days    Referring Provider: triad hospitalist dr Wynelle Cleveland Primary Care Physician:  At [facility] in [place].  Primary Gastroenterologist:  Dr. Sydell Axon and Dr Oneida Alar.     Reason for Consultation:  Anemia, gi bleed   HPI: Joseph Petersen is a 68 y.o. male.  Has planned laryngectomy at [facility] for later this month. origionally had neck surgery in nov 2013 with chemo radiation post op but cancer has recurred.   Pt had EGD by Dr Oneida Alar on 3/6 for UGI bleed, hgb 5.7.   Found medium sized clean based duodenal bulb ulcer, stricture at GEJ, .  EGD sedation complicated by apnea/unresponsiveness requiring ambu bag,Romazicon, Narcan and racemic epinephrine. Transfused to hgb of 8.2 at discharge.  He was H pylori + ("severe H Pylori gastritis" per path).  Went home and took the abx but not the PPI and took a dose or 2 of Copywriter, advertising for indigesition.  Started PPI on 3/10 when he had 2 days to go on the abx.  No other NSAIDs.  Presented 3/11 with recurrent anemia, GIB.  Stool is dark as well as medium red.  No nausea, no abdominal pain.  No dizziness or fainting.  Due to complications of anesthesia, transferred for EGD to [place].   S/p 3 PRBCs Hgb 6.2 yest to 9.0 this AM. BUN normal.  Iron 40 Ferritin 204, sat 15%.   Getting harder to swallow as pills and solids choke a bit at back of throat.       Past Medical History  Diagnosis Date  . Hypertension   . Diabetes 10/12/2013  . Throat cancer 10/12/2013  . Hyperlipidemia 10/12/2013    Past Surgical History  Procedure Laterality Date  . Throat surgery  2013    remove cancer, [facility]  . Colonoscopy      [facility] around 2007? polyps  . Esophagogastroduodenoscopy N/A 10/13/2013    Procedure: ESOPHAGOGASTRODUODENOSCOPY (EGD);  Surgeon:  Danie Binder, MD;  Location: [facility];  Service: Endoscopy;  Laterality: N/A;    Prior to Admission medications   Medication Sig Start Date End Date Taking? Authorizing Provider  amoxicillin (AMOXIL) 500 MG capsule Take 500 mg by mouth 4 (four) times daily. 10/17/13  Yes Historical Provider, MD  clarithromycin (BIAXIN) 500 MG tablet Take 500 mg by mouth 2 (two) times daily. 10/17/13  Yes Historical Provider, MD  metFORMIN (GLUCOPHAGE) 500 MG tablet Take 500 mg by mouth daily.   Yes Historical Provider, MD  pantoprazole (PROTONIX) 40 MG tablet Take 1 tablet (40 mg total) by mouth 2 (two) times daily before a meal. 10/14/13  Yes Samuella Cota, MD  simvastatin (ZOCOR) 40 MG tablet Take 40 mg by mouth daily.   Yes Historical Provider, MD    Scheduled Meds: . acetaminophen  650 mg Oral Once  . diphenhydrAMINE  25 mg Oral Once  . insulin aspart  0-9 Units Subcutaneous 6 times per day  . sodium chloride  500 mL Intravenous Once   Infusions: .  sodium chloride 100 mL/hr at 10/20/13 0441  . sodium chloride    . pantoprozole (PROTONIX) infusion 8 mg/hr (10/20/13 0552)   PRN Meds: ondansetron (ZOFRAN) IV, ondansetron   Allergies as of 10/18/2013  . (No Known Allergies)    Family History  Problem Relation Age of Onset  . Colon cancer Neg Hx     History   Social History  . Marital Status: Legally Separated    Spouse Name: N/A    Number of Children: 3  . Years of Education: N/A   Occupational History  . Not on file.   Social History Main Topics  . Smoking status: Former Research scientist (life sciences)  . Smokeless tobacco: Not on file  . Alcohol Use: No  . Drug Use: No  . Sexual Activity: Not on file   Other Topics Concern  . Not on file   Social History Narrative  . No narrative on file    REVIEW OF SYSTEMS: Constitutional:  No fainiting.  Energy is low ENT:  No nose bleeds Pulm:  Some SOB but able to do daily tasks at home CV:  No palpitations, no LE edema.  GU:  No hematuria, +  anocturia and frequency GI:  Per HPI Heme:  Per HPI   Transfusions:  Per HPI Neuro:  No headaches, no peripheral tingling or numbness Derm:  No itching, no rash or sores.  Endocrine:  No sweats or chills.  No polyuria or dysuria Immunization:  Not documented.  Travel:  None beyond local counties in last few months.    PHYSICAL EXAM: Vital signs in last 24 hours: Filed Vitals:   10/20/13 0806  BP: 148/85  Pulse: 97  Temp: 98.1 F (36.7 C)  Resp: 16   Wt Readings from Last 3 Encounters:  10/20/13 71.4 kg (157 lb 6.5 oz)  10/20/13 71.4 kg (157 lb 6.5 oz)  10/12/13 75.479 kg (166 lb 6.4 oz)   General: looks thin and unwell.  Comfortable nad Head:  Temporal wasting  Eyes:  No icterus, no pallor Ears:  Not HOH  Nose:  No congestion Mouth:  Poor dentition.  No blood.  Few remaining teeth.  Tongue midline Neck:  Swelling and firmess, left > right.   Lungs:  Exceptionally hoarse.  Diminished.  Dyspnea with speach Heart: RRR Abdomen:  Soft, thin, not tender or distended.  No mass.   Rectal: some scant fresh blood on glove.  Enlarged prostate, no mass, no hemorrhoids   Musc/Skeltl: some arthritic change in hands.  kyphosis Extremities:  No CCE  Neurologic:  Oriented x 3.  No limb weakness Skin:  No rash or sores Tattoos:  none   Psych:  Cooperative, relaxed.   Intake/Output from previous day: 03/12 0701 - 03/13 0700 In: 3012.5 [I.V.:3000; Blood:12.5] Out: 3115 [Urine:3115] Intake/Output this shift: Total I/O In: -  Out: 300 [Urine:300]  LAB RESULTS:  Recent Labs  10/18/13 1653 10/19/13 0710 10/19/13 1502 10/19/13 2123 10/20/13 0236  WBC 7.1 4.0  --   --  6.2  HGB 6.2* 7.8* 9.3* 8.5* 9.0*  HCT 18.7* 22.4* 27.3* 25.2* 26.2*  PLT 172 120*  --   --  160   BMET Lab Results  Component Value Date   NA 135* 10/20/2013   NA 136* 10/19/2013   NA 133* 10/18/2013   K 4.3 10/20/2013   K 5.0 10/19/2013   K 4.8 10/18/2013   CL 96 10/20/2013   CL 99 10/19/2013   CL 96  10/18/2013   CO2  27 10/20/2013   CO2 30 10/19/2013   CO2 29 10/18/2013   GLUCOSE 92 10/20/2013   GLUCOSE 85 10/19/2013   GLUCOSE 205* 10/18/2013   BUN 14 10/20/2013   BUN 13 10/19/2013   BUN 18 10/18/2013   CREATININE 0.97 10/20/2013   CREATININE 1.06 10/19/2013   CREATININE 1.08 10/18/2013   CALCIUM 9.1 10/20/2013   CALCIUM 8.8 10/19/2013   CALCIUM 8.2* 10/18/2013   LFT  Recent Labs  10/19/13 0710  PROT 5.6*  ALBUMIN 2.7*  AST 13  ALT 13  ALKPHOS 35*  BILITOT 1.3*   PT/INR Lab Results  Component Value Date   INR 1.20 10/19/2013   INR 1.07 10/12/2013   Hepatitis Panel No results found for this basename: HEPBSAG, HCVAB, HEPAIGM, HEPBIGM,  in the last 72 hours C-Diff No components found with this basename: cdiff   Lipase  No results found for this basename: lipase    Drugs of Abuse  No results found for this basename: labopia, cocainscrnur, labbenz, amphetmu, thcu, labbarb     RADIOLOGY STUDIES: Dg Chest Portable 1 View  10/18/2013   CLINICAL DATA Shortness of breath.  History throat cancer.  EXAM PORTABLE CHEST - 1 VIEW  COMPARISON None.  FINDINGS Mild prominence of the mediastinum is noted. This may be from prominent great vessels however mass lesion or adenopathy cannot be excluded. Lungs are clear of acute infiltrates. Left apical pleural thickening is noted. Pleural based left apical pleural or pulmonary mass cannot be excluded. Adjacent surgical clips noted over the left apex. No pleural effusion or pneumothorax. No focal osseous abnormality. Heart size and pulmonary vascularity normal.  IMPRESSION 1. Left apical pleural-based density. CT of the chest suggested for further evaluation. 2. Mild prominence of the superior mediastinum, most likely vascular, superior mediastinal mass cannot be completely excluded and CT would be useful for further evaluation of this region as well. 3. No acute cardiopulmonary disease.  No focal pulmonary infiltrate.  SIGNATURE  Electronically Signed    By: Marcello Moores  Register   On: 10/18/2013 17:37    ENDOSCOPIC STUDIES: Per HPI  IMPRESSION:   *  Duodenal ulcer, clean based on EGD 10/13/13.  Presented with melena and anemia. 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  Completed 2 days abx for H Pylori but did not take PPI until 3/10.  Resumed daily use of Alka Seltzer for indigestion. EGD sedation (Versed 6 mg IV and Demerol 75 mg IV). complicated by period of unresponsiveness/apnea requiring ambu bag,Romazicon, Narcan and racemic epinephrine   *  Severe h pylori gastritis. Initiated amoxil and biaxin but failed to take concomittant PPI until the 3rd day of therapy.   *  Recurrent GI bleeding and ABL anemia. S/p transfusions.  On exam there is scant fresh blood, not melen Last colonoscopy at [facility] > 5 yrs ago, no recurrent polyps and told to have 10 yr follow up at unknown year.   *  Medical non-cmpliance with   *  Head and neck cancer.  surgery in Nov 2013 followed with chemo/radiation.  Cancer recurrent.   Laryngectomy planned in late 10/2013 at [facility]   *  DM 2.   *  Left pleural density. Rule out mediastinal mass.    PLAN:     *  EGD with MAC sedation.  May need colonoscopy if he passes fresh blood  *  Continue PPI drip as doing.     Azucena Freed  10/20/2013, 8:57 AM Pager: [phone] Attending MD note:   I have  taken a history, examined the patient, and reviewed the chart. I agree with the Advanced Practitioner's impression and recommendations. Known duaodenal ulcer on recent EGD by Dr Ivor Reining, ?difficult sedation, now recurrent bleeding. Plan to reendosocpe under monitored anesthesia.  [facility] Pager # [phone]

## 2013-10-20 NOTE — Anesthesia Postprocedure Evaluation (Signed)
Anesthesia Post Note  Patient: Joseph Petersen  Procedure(s) Performed: Procedure(s) (LRB): ESOPHAGOGASTRODUODENOSCOPY (EGD) (N/A)  Anesthesia type: general  Patient location: PACU  Post pain: Pain level controlled  Post assessment: Patient's Cardiovascular Status Stable  Last Vitals:  Filed Vitals:   10/20/13 1350  BP: 160/83  Pulse: 87  Temp:   Resp: 16    Post vital signs: Reviewed and stable  Level of consciousness: sedated  Complications: No apparent anesthesia complications

## 2013-10-20 NOTE — Progress Notes (Signed)
10/20/2013 Patient clearing throat while eating, he stated he normally have problem swallowing. Rn did reported to Dr Wynelle Cleveland and she is aware. [facility] RN.

## 2013-10-20 NOTE — Transfer of Care (Signed)
Immediate Anesthesia Transfer of Care Note  Patient: Joseph Petersen  Procedure(s) Performed: Procedure(s): ESOPHAGOGASTRODUODENOSCOPY (EGD) (N/A)  Patient Location: Endoscopy Unit  Anesthesia Type:General  Level of Consciousness: awake  Airway & Oxygen Therapy: Patient Spontanous Breathing and Patient connected to nasal cannula oxygen  Post-op Assessment: Report given to PACU RN, Post -op Vital signs reviewed and stable and Patient moving all extremities  Post vital signs: Reviewed and stable  Complications: No apparent anesthesia complications

## 2013-10-20 NOTE — Op Note (Signed)
[facility] [place], [id number]   ENDOSCOPY PROCEDURE REPORT  PATIENT: Joseph Petersen, Joseph Petersen  MR#: [id number] BIRTHDATE: 12-Jan-1946 , 28  yrs. old GENDER: Male ENDOSCOPIST: Lafayette Dragon, MD REFERRED BY:  Barney Drain, M.D. ,Dr Wynelle Cleveland PROCEDURE DATE:  10/20/2013 PROCEDURE:  EGD w/ biopsy and Savary dilation of esophagus ASA CLASS:     Class III INDICATIONS:  Recent diagnosis of duodenal ulcer on 10/13/2013 on upper endoscopy.by Dr Oneida Alar,  Treated for H.  pylori he for several days.without PPI,  New-onset of melena and anemia .Patient is a difficult sedation, needs general anesthesia. due to respiratory problems during last procedure Transferred to our facilityfor MAC sedation.  During a repeat upper endoscopy,. MEDICATIONS: General endotracheal anesthesia (GETA) TOPICAL ANESTHETIC: none  DESCRIPTION OF PROCEDURE: After the risks benefits and alternatives of the procedure were thoroughly explained, informed consent was obtained.  The Pentax Gastroscope [id number] endoscope was introduced through the mouth and advanced to the second portion of the duodenum. Without limitations.  The instrument was slowly withdrawn as the mucosa was fully examined.      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 radiation There was no obstruction. Mid esophageal mucosa was normal. There was a moderately severe benign-appearing concentric distal esophageal stricture which allowed the endoscope to traverse without difficulty. The diameter of the stricture was about 15 mm.  Stomach: Gastric body, antrum and pyloric outlet were unremarkable there was no blood or food in the stomach  Duodenum: There was a shallow clean-based duodenal ulcer at the outlet from the duodenal bulb which measured about 1 cm in diameter and had erythematous  and slightly raised edges.edges.There were  no stigmatic or bleeding. There was no visible vessel and there was no blood or bleeding from the duodenal bulb or descending duodenum. Multiple biopsies were obtained from the edge of the ulcer. 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  COMPLICATIONS: There were no complications. 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 Nothing to account for melenic stools Status post biopsies from duodenal ulcer small duodenal diverticulum  RECOMMENDATIONS: The upper endoscopy does not explain patient's melenic stools. Repeat rectal exam following upper endoscopy again confirms maroon color stool. This could be a lower GI bleed. Consider colonoscopy. Since he is a difficult sedation he will again require general anesthesia and therefore may benefit from having the procedure done in this facility REPEAT EXAM:  eSigned:  Lafayette Dragon, MD 10/20/2013 1:04 PM   CC:  PATIENT NAME:  Blair, [place] MR#: [id number]

## 2013-10-20 NOTE — Anesthesia Preprocedure Evaluation (Signed)
Anesthesia Evaluation    Reviewed: Allergy & Precautions, H&P , NPO status , Patient's Chart, lab work & pertinent test results  History of Anesthesia Complications Negative for: history of anesthetic complications  Airway       Dental   Pulmonary former smoker,          Cardiovascular hypertension,     Neuro/Psych negative neurological ROS  negative psych ROS   GI/Hepatic PUD,   Endo/Other  diabetes, Insulin Dependent  Renal/GU negative Renal ROS     Musculoskeletal   Abdominal   Peds  Hematology   Anesthesia Other Findings   Reproductive/Obstetrics                           Anesthesia Physical Anesthesia Plan  ASA: III  Anesthesia Plan: General   Post-op Pain Management:    Induction: Intravenous  Airway Management Planned: Oral ETT  Additional Equipment:   Intra-op Plan:   Post-operative Plan: Extubation in OR  Informed Consent:   Plan Discussed with: CRNA, Anesthesiologist and Surgeon  Anesthesia Plan Comments:         Anesthesia Quick Evaluation

## 2013-10-20 NOTE — Progress Notes (Signed)
[facility] PROGRESS NOTE  Joseph Petersen MQK:[id number] DOB: 08-16-1945 DOA: 10/18/2013 PCP: No primary provider on file.  Assessment/Plan: 1. Acute GI bleeding.  He was recently admitted approximately one week ago for similar complaints. He had EGD done at that time which revealed a duodenal ulcer, without any signs of active bleeding. The patient was placed on proton pump inhibitors and was asked to avoid any nonsteroidal anti-inflammatories. He has recurrent GI bleeding which has led to this admission. Hemoglobin on admission was 6.2. He was started on Protonix infusion. He was seen by gastroenterology and felt that repeat EGD would be needed. It was felt that this procedure would need to be done under anesthesia, since the patient had significant stridor and briefly needed Ambu bag during his prior EGD. This case was discussed by gastroenterology with anesthesiology, and with patient's history of throat cancer, he would be at extremely high-risk for airway complications for any procedure even with endotracheal intubation. It was not felt the patient should have another EGD or other procedures done at [facility]. Recommendations were for transfer to [facility] for further care. Patient has had an EGD today and Duodenal ulcer appears to be clean- NOTE esophagus is ulcerated- Dr Olevia Perches recommends a Colonoscopy- no further bleeding at this point 2.  Acute blood loss anemia. Status post transfusions of 3 U PRBCs-  hemoglobin stable 3. Throat cancer. Patient is set up for surgical management at the [place] in the coming weeks. Patient also has an abnormal chest x-ray and with his history of cancer, would likely benefit from CT chest. Although this can be done non-emergently. 4. Hypotension. Likely related to GI bleeding. Resolved. 5. Diabetes. Continue to hold metformin. He is on sliding-scale insulin.  Code Status: full code Family Communication: discussed with patient  Disposition Plan:  transfer to [facility] for further care.   Consultants:  Gastroenterology  Procedures:  3/13 EGD   HPI/Subjective: No further bloody BMs- no pain.   Objective: Filed Vitals:   10/20/13 1350  BP: 160/83  Pulse: 87  Temp:   Resp: 16    Intake/Output Summary (Last 24 hours) at 10/20/13 1629 Last data filed at 10/20/13 1251  Gross per 24 hour  Intake   3200 ml  Output   2940 ml  Net    260 ml   Filed Weights   10/18/13 1936 10/19/13 0500 10/20/13 0400  Weight: 74 kg (163 lb 2.3 oz) 74 kg (163 lb 2.3 oz) 71.4 kg (157 lb 6.5 oz)    Exam:   General:  NAD, cachetic, hoarse voice  Cardiovascular: S1, S2 RRR  Respiratory: CTA B  Abdomen: soft, nt, nd, bs+  Musculoskeletal: no edema b/l   Data Reviewed: Basic Metabolic Panel:  Recent Labs Lab 10/18/13 1653 10/19/13 0710 10/20/13 0236  NA 133* 136* 135*  K 4.8 5.0 4.3  CL 96 99 96  CO2 _0 GLUCOSE 205* 85 92  BUN _1 CREATININE 1.08 1.06 0.97  CALCIUM 8.2* 8.8 9.1   Liver Function Tests:  Recent Labs Lab 10/19/13 0710  AST 13  ALT 13  ALKPHOS 35*  BILITOT 1.3*  PROT 5.6*  ALBUMIN 2.7*   No results found for this basename: LIPASE, AMYLASE,  in the last 168 hours No results found for this basename: AMMONIA,  in the last 168 hours CBC:  Recent Labs Lab 10/14/13 0546 10/18/13 1653 10/19/13 0710 10/19/13 1502 10/19/13 2123 10/20/13 0236  WBC 5.7  7.1 4.0  --   --  6.2  NEUTROABS  --  6.4  --   --   --   --   HGB 8.2* 6.2* 7.8* 9.3* 8.5* 9.0*  HCT 23.9* 18.7* 22.4* 27.3* 25.2* 26.2*  MCV 83.9 83.9 84.5  --   --  83.7  PLT 117* 172 120*  --   --  160   Cardiac Enzymes:  Recent Labs Lab 10/18/13 1653  TROPONINI <0.30   BNP (last 3 results) No results found for this basename: PROBNP,  in the last 8760 hours CBG:  Recent Labs Lab 10/19/13 2015 10/19/13 2331 10/20/13 0420 10/20/13 0807 10/20/13 1214  GLUCAP 95 79 86 88 79    Recent Results (from the  past 240 hour(s))  MRSA PCR SCREENING     Status: None   Collection Time    10/18/13  7:43 PM      Result Value Ref Range Status   MRSA by PCR NEGATIVE  NEGATIVE Final   Comment:            The GeneXpert MRSA Assay (FDA     approved for NASAL specimens     only), is one component of a     comprehensive MRSA colonization     surveillance program. It is not     intended to diagnose MRSA     infection nor to guide or     monitor treatment for     MRSA infections.     Studies: Dg Chest Portable 1 View  10/18/2013   CLINICAL DATA Shortness of breath.  History throat cancer.  EXAM PORTABLE CHEST - 1 VIEW  COMPARISON None.  FINDINGS Mild prominence of the mediastinum is noted. This may be from prominent great vessels however mass lesion or adenopathy cannot be excluded. Lungs are clear of acute infiltrates. Left apical pleural thickening is noted. Pleural based left apical pleural or pulmonary mass cannot be excluded. Adjacent surgical clips noted over the left apex. No pleural effusion or pneumothorax. No focal osseous abnormality. Heart size and pulmonary vascularity normal.  IMPRESSION 1. Left apical pleural-based density. CT of the chest suggested for further evaluation. 2. Mild prominence of the superior mediastinum, most likely vascular, superior mediastinal mass cannot be completely excluded and CT would be useful for further evaluation of this region as well. 3. No acute cardiopulmonary disease.  No focal pulmonary infiltrate.  SIGNATURE  Electronically Signed   By: Thomas  Register   On: 10/18/2013 17:37    Scheduled Meds: . acetaminophen  650 mg Oral Once  . diphenhydrAMINE  25 mg Oral Once  . insulin aspart  0-9 Units Subcutaneous 6 times per day  . peg 3350 powder  0.5 kit Per Tube Once   And  . [START ON 10/21/2013] peg 3350 powder  0.5 kit Per Tube Once  . sodium chloride  500 mL Intravenous Once   Continuous Infusions: . sodium chloride 100 mL/hr at 10/20/13 1506  . sodium  chloride    . sodium chloride    . pantoprozole (PROTONIX) infusion 8 mg/hr (10/20/13 0552)       Time spent: 25mins    RIZWAN,SAIMA, MD  [facility]  If 7PM-7AM, please contact night-coverage at [email or web address], password [email or web address] 10/20/2013, 4:29 PM  LOS: 2 days              

## 2013-10-21 ENCOUNTER — Encounter (HOSPITAL_COMMUNITY): Payer: Self-pay | Admitting: Certified Registered Nurse Anesthetist

## 2013-10-21 ENCOUNTER — Encounter (HOSPITAL_COMMUNITY)
Admission: EM | Disposition: A | Discharge status: Home / Self Care | Payer: Self-pay | Source: Home / Self Care | Attending: Internal Medicine

## 2013-10-21 ENCOUNTER — Encounter (HOSPITAL_COMMUNITY): Payer: Medicare HMO | Admitting: Certified Registered Nurse Anesthetist

## 2013-10-21 ENCOUNTER — Inpatient Hospital Stay (HOSPITAL_COMMUNITY): Payer: Medicare HMO

## 2013-10-21 ENCOUNTER — Inpatient Hospital Stay (HOSPITAL_COMMUNITY): Payer: Medicare HMO | Admitting: Certified Registered Nurse Anesthetist

## 2013-10-21 HISTORY — PX: COLONOSCOPY: SHX5424

## 2013-10-21 LAB — GLUCOSE, CAPILLARY
GLUCOSE-CAPILLARY: 106 mg/dL — AB (ref 70–99)
Glucose-Capillary: 96 mg/dL (ref 70–99)
Glucose-Capillary: 96 mg/dL (ref 70–99)
Glucose-Capillary: 94 mg/dL (ref 70–99)
Glucose-Capillary: 85 mg/dL (ref 70–99)
Glucose-Capillary: 76 mg/dL (ref 70–99)
Glucose-Capillary: 122 mg/dL — ABNORMAL HIGH (ref 70–99)

## 2013-10-21 LAB — HEMOGLOBIN AND HEMATOCRIT, BLOOD
HCT: 20.2 % — ABNORMAL LOW (ref 39.0–52.0)
HEMATOCRIT: 19 % — AB (ref 39.0–52.0)
Hemoglobin: 7 g/dL — ABNORMAL LOW (ref 13.0–17.0)
Hemoglobin: 6.5 g/dL — CL (ref 13.0–17.0)

## 2013-10-21 LAB — PREPARE RBC (CROSSMATCH)

## 2013-10-21 LAB — BASIC METABOLIC PANEL
BUN: 11 mg/dL (ref 6–23)
CALCIUM: 8.1 mg/dL — AB (ref 8.4–10.5)
CO2: 24 mEq/L (ref 19–32)
Chloride: 95 mEq/L — ABNORMAL LOW (ref 96–112)
Creatinine, Ser: 0.95 mg/dL (ref 0.50–1.35)
GFR, EST NON AFRICAN AMERICAN: 84 mL/min — AB (ref >90)
GLUCOSE: 108 mg/dL — AB (ref 70–99)
POTASSIUM: 4 meq/L (ref 3.7–5.3)
Sodium: 131 mEq/L — ABNORMAL LOW (ref 137–147)

## 2013-10-21 LAB — ABO/RH: ABO/RH(D): O POS

## 2013-10-21 SURGERY — COLONOSCOPY
Anesthesia: Choice

## 2013-10-21 SURGERY — COLONOSCOPY
Anesthesia: Monitor Anesthesia Care

## 2013-10-21 MED ORDER — MIDAZOLAM HCL 2 MG/2ML IJ SOLN
INTRAMUSCULAR | Status: AC
  Filled 2013-10-21: qty 2

## 2013-10-21 MED ORDER — PROPOFOL INFUSION 10 MG/ML OPTIME
INTRAVENOUS | Status: DC | PRN
  Administered 2013-10-21: 75 ug/kg/min via INTRAVENOUS

## 2013-10-21 MED ORDER — PEG-KCL-NACL-NASULF-NA ASC-C 100 G PO SOLR
0.5000 | Freq: Once | ORAL | Status: AC
  Administered 2013-10-21: 100 g
  Filled 2013-10-21: qty 1

## 2013-10-21 MED ORDER — TECHNETIUM TC 99M-LABELED RED BLOOD CELLS IV KIT
25.0000 | PACK | Freq: Once | INTRAVENOUS | Status: AC | PRN
  Administered 2013-10-21: 25 via INTRAVENOUS

## 2013-10-21 MED ORDER — ONDANSETRON HCL 4 MG/2ML IJ SOLN: 4.0000 mg | Freq: Once | INTRAMUSCULAR | Status: DC | PRN

## 2013-10-21 MED ORDER — SODIUM CHLORIDE 0.9 % IV SOLN
8.0000 mg/h | INTRAVENOUS | Status: DC
  Administered 2013-10-22: 8 mg/h via INTRAVENOUS
  Filled 2013-10-21 (×3): qty 80

## 2013-10-21 MED ORDER — SUCCINYLCHOLINE CHLORIDE 20 MG/ML IJ SOLN
INTRAMUSCULAR | Status: AC
  Filled 2013-10-21: qty 1

## 2013-10-21 MED ORDER — FENTANYL CITRATE 0.05 MG/ML IJ SOLN
INTRAMUSCULAR | Status: AC
  Filled 2013-10-21: qty 5

## 2013-10-21 MED ORDER — PROPOFOL 10 MG/ML IV BOLUS
INTRAVENOUS | Status: AC
  Filled 2013-10-21: qty 20

## 2013-10-21 MED ORDER — LACTATED RINGERS IV SOLN
INTRAVENOUS | Status: DC | PRN
  Administered 2013-10-21: 14:00:00 via INTRAVENOUS

## 2013-10-21 MED ORDER — LIDOCAINE HCL (CARDIAC) 20 MG/ML IV SOLN
INTRAVENOUS | Status: AC
  Filled 2013-10-21: qty 5

## 2013-10-21 MED ORDER — ONDANSETRON HCL 4 MG/2ML IJ SOLN
INTRAMUSCULAR | Status: AC
  Filled 2013-10-21: qty 2

## 2013-10-21 NOTE — Progress Notes (Signed)
Pt transferred to procedural area with [place] nurse.  Pt was alert and oriented prior to transport with family at bedside. OR was informed pt continued to have bloody watery stool post second oral prep.

## 2013-10-21 NOTE — Progress Notes (Signed)
CRITICAL VALUE ALERT  Critical value received:  hgb 6.5  Date of notification:  10/21/13  Time of notification:  7680  Critical value read back:yes  Nurse who received alert:  Dorena Bodo  MD notified (1st page):  Rizwan  Time of first page:  8811  MD notified (2nd page):  Time of second page:  Responding MD:   Time MD responded:

## 2013-10-21 NOTE — Transfer of Care (Signed)
Immediate Anesthesia Transfer of Care Note  Patient: Joseph Petersen  Procedure(s) Performed: Procedure(s): COLONOSCOPY (N/A)  Patient Location: PACU  Anesthesia Type:MAC  Level of Consciousness: awake, alert  and oriented  Airway & Oxygen Therapy: Patient Spontanous Breathing and Patient connected to face mask oxygen  Post-op Assessment: Report given to PACU RN and Post -op Vital signs reviewed and stable  Post vital signs: Reviewed and stable  Complications: No apparent anesthesia complications

## 2013-10-21 NOTE — Anesthesia Postprocedure Evaluation (Signed)
  Anesthesia Post-op Note  Patient: Joseph Petersen  Procedure(s) Performed: Procedure(s): COLONOSCOPY (N/A)  Patient Location: PACU  Anesthesia Type:MAC  Level of Consciousness: awake, alert  and oriented  Airway and Oxygen Therapy: Patient Spontanous Breathing  Post-op Pain: none  Post-op Assessment: Post-op Vital signs reviewed, Patient's Cardiovascular Status Stable, Respiratory Function Stable, Patent Airway, No signs of Nausea or vomiting and Pain level controlled  Post-op Vital Signs: Reviewed and stable  Complications: No apparent anesthesia complications

## 2013-10-21 NOTE — Anesthesia Procedure Notes (Signed)
Procedure Name: MAC Date/Time: 10/21/2013 2:42 PM Performed by: Eligha Bridegroom Pre-anesthesia Checklist: Patient identified, Emergency Drugs available, Suction available and Patient being monitored Patient Re-evaluated:Patient Re-evaluated prior to inductionOxygen Delivery Method: Simple face mask Preoxygenation: Pre-oxygenation with 100% oxygen Intubation Type: IV induction Placement Confirmation: positive ETCO2 and breath sounds checked- equal and bilateral Dental Injury: Teeth and Oropharynx as per pre-operative assessment

## 2013-10-21 NOTE — Op Note (Signed)
[facility] [place], [address]   COLONOSCOPY PROCEDURE REPORT  PATIENT: [name], [name]  MR#: [id number] BIRTHDATE: 07/25/1946 , 63  yrs. old GENDER: Male ENDOSCOPIST: Lafayette Dragon, MD REFERRED BY:Dr Rizwan, Dr Trinda Pascal PROCEDURE DATE:  10/21/2013 PROCEDURE:   Colonoscopy, diagnostic First Screening Colonoscopy - Avg.  risk and is 50 yrs.  old or older - No.  Prior Negative Screening - Now for repeat screening. N/A  History of Adenoma - Now for follow-up colonoscopy & has been > or = to 3 yrs.  N/A  Polyps Removed Today? No.  Recommend repeat exam, <10 yrs? No. ASA CLASS:   Class III INDICATIONS:large-volume hematochezia.  Nonbleeding duodenal ulcer, continued bleeding.  Hemoglobin 7.0 after 3 units of packed cells.  MEDICATIONS: MAC sedation, administered by CRNA  DESCRIPTION OF PROCEDURE:   After the risks benefits and alternatives of the procedure were thoroughly explained, informed consent was obtained.  A digital rectal exam revealed no abnormalities of the rectum.   The Pentax Ped Colon [id number] endoscope was introduced through the anus and advanced to the cecum, which was identified by both the appendix and ileocecal valve. No adverse events experienced.   The quality of the prep was adequate, using MoviPrep  The instrument was then slowly withdrawn as the colon was fully examined.      COLON FINDINGS: There was moderate diverticulosis noted throughout the entire examined colon with associated angulation. post large amount of relatively fresh blood throughout the colon starting at the rectum, through the descending transverse and descending colon all the way to the cecum .Ileocecal valve appeared normal. There was no blood exitin from the ileocecal valve. I was unable to intubate the terminal ileum. Blood was cleared from the cecal pouch. There were no AV malformations in the cecal pouch.. There was no evidence of  AVMs although the prep was supple optimal G-tube presence of blood in the colon. Most of the diverticuli were located in the descending colon. The findings were consistent with active diverticular bleed but unfortunately I was unable to discern the exact location of the bleeding diverticulum since the blood was evenly distributed throughout the colon Retroflexed views revealed no abnormalities. The time to cecum=8 minutes 15 seconds. Withdrawal time=28.minutes .05 seconds.  The scope was withdrawn and the procedure completed. COMPLICATIONS: There were no complications.  ENDOSCOPIC IMPRESSION: There was moderate diverticulosis noted throughout the entire examined colon  Large amount of red blood and some maroon bloodpresent  throughout the colon Suspect active  diverticular bleed RECOMMENDATIONS: Bowl rest Transfuse to keep Hgb>8.0 RBC pool scan in Nuclear Medicine now IR notified of possible angiogram if bleeding scan positive  eSigned:  Lafayette Dragon, MD 10/21/2013 4:37 PM   cc:   PATIENT NAME:  Joseph Petersen, Joseph Petersen MR#: [id number]

## 2013-10-21 NOTE — Anesthesia Preprocedure Evaluation (Addendum)
Anesthesia Evaluation  Patient identified by MRN, date of birth, ID band Patient awake    Reviewed: Allergy & Precautions, H&P , NPO status , Patient's Chart, lab work & pertinent test results  History of Anesthesia Complications Negative for: history of anesthetic complications  Airway Mallampati: III TM Distance: >3 FB Neck ROM: Full    Dental  (+) Dental Advisory Given, Edentulous Upper,    Pulmonary neg shortness of breath, neg sleep apnea, neg COPDformer smoker,  breath sounds clear to auscultation        Cardiovascular hypertension, Pt. on medications - CHF Rate:Normal + Systolic murmurs    Neuro/Psych negative neurological ROS  negative psych ROS   GI/Hepatic Neg liver ROS, PUD, GI bleed on protonix, neg egd, no active N/V   Endo/Other  diabetes, Type 2, Oral Hypoglycemic Agents  Renal/GU negative Renal ROS     Musculoskeletal   Abdominal   Peds  Hematology  (+) anemia ,   Anesthesia Other Findings   Reproductive/Obstetrics                         Anesthesia Physical Anesthesia Plan  ASA: III  Anesthesia Plan: MAC   Post-op Pain Management:    Induction: Intravenous  Airway Management Planned: Simple Face Mask and Natural Airway  Additional Equipment:   Intra-op Plan:   Post-operative Plan:   Informed Consent: I have reviewed the patients History and Physical, chart, labs and discussed the procedure including the risks, benefits and alternatives for the proposed anesthesia with the patient or authorized representative who has indicated his/her understanding and acceptance.   Dental advisory given  Plan Discussed with: CRNA, Anesthesiologist and Surgeon  Anesthesia Plan Comments:        Anesthesia Quick Evaluation

## 2013-10-21 NOTE — Preoperative (Signed)
Beta Blockers   Reason not to administer Beta Blockers:Not Applicable 

## 2013-10-21 NOTE — Progress Notes (Signed)
[facility] PROGRESS NOTE  ARMISTEAD SULT ZDG:[id number] DOB: [birth year] DOA: 10/18/2013 PCP: No primary provider on file.  Assessment/Plan: 1. Acute GI bleeding.  He was recently admitted approximately one week ago for similar complaints. He had EGD done at that time which revealed a duodenal ulcer, without any signs of active bleeding. The patient was placed on proton pump inhibitors and was asked to avoid any nonsteroidal anti-inflammatories. He has recurrent GI bleeding which has led to this admission. Hemoglobin on admission was 6.2. He was started on Protonix infusion. He was seen by gastroenterology and felt that repeat EGD would be needed. It was felt that this procedure would need to be done under anesthesia, since the patient had significant stridor and briefly needed Ambu bag during his prior EGD. This case was discussed by gastroenterology with anesthesiology, and with patient's history of throat cancer, he would be at extremely high-risk for airway complications for any procedure even with endotracheal intubation. It was not felt the patient should have another EGD or other procedures done at [facility]. Recommendations were for transfer to [facility] for further care. Patient has had an EGD today and Duodenal ulcer appears to be clean- NOTE esophagus is ulcerated- Dr Olevia Perches recommends a Colonoscopy- had bleeding with colon prep  2.  Acute blood loss anemia. Status post transfusions of 3 U PRBCs-  Will give another unit PRBC now 3. Throat cancer. Patient is set up for surgical management at the [place] in the coming weeks. Patient also has an abnormal chest x-ray and with his history of cancer. 4. Hypotension. Likely related to GI bleeding. Resolved. 5. Diabetes. Continue to hold metformin. He is on sliding-scale insulin.  Code Status: full code Family Communication: discussed with patient  Disposition Plan: transfer to [facility] for further  care.   Consultants:  Gastroenterology  Procedures:  3/13 EGD   HPI/Subjective: No further bloody BMs- no pain.   Objective: Filed Vitals:   10/21/13 1128  BP: 129/75  Pulse:   Temp: 98.2 F (36.8 C)  Resp: 18    Intake/Output Summary (Last 24 hours) at 10/21/13 1133 Last data filed at 10/21/13 1115  Gross per 24 hour  Intake 6762.5 ml  Output    850 ml  Net 5912.5 ml   Filed Weights   10/20/13 0400 10/21/13 0403 10/21/13 0800  Weight: 71.4 kg (157 lb 6.5 oz) 71.4 kg (157 lb 6.5 oz) 74.2 kg (163 lb 9.3 oz)    Exam:   General:  NAD, cachetic, hoarse voice  Cardiovascular: S1, S2 RRR  Respiratory: CTA B  Abdomen: soft, nt, nd, bs+  Musculoskeletal: no edema b/l   Data Reviewed: Basic Metabolic Panel:  Recent Labs Lab 10/18/13 1653 10/19/13 0710 10/20/13 0236 10/21/13 0340  NA 133* 136* 135* 131*  K 4.8 5.0 4.3 4.0  CL 96 99 96 95*  CO2 29 30 27 24   GLUCOSE 205* 85 92 108*  BUN 18 13 14 11   CREATININE 1.08 1.06 0.97 0.95  CALCIUM 8.2* 8.8 9.1 8.1*   Liver Function Tests:  Recent Labs Lab 10/19/13 0710  AST 13  ALT 13  ALKPHOS 35*  BILITOT 1.3*  PROT 5.6*  ALBUMIN 2.7*   No results found for this basename: LIPASE, AMYLASE,  in the last 168 hours No results found for this basename: AMMONIA,  in the last 168 hours CBC:  Recent Labs Lab 10/18/13 1653 10/19/13 0710 10/19/13 1502 10/19/13 2123 10/20/13 0236 10/20/13 1707 10/21/13 0340  WBC 7.1 4.0  --   --  6.2  --   --   NEUTROABS 6.4  --   --   --   --   --   --   HGB 6.2* 7.8* 9.3* 8.5* 9.0* 8.9* 7.0*  HCT 18.7* 22.4* 27.3* 25.2* 26.2* 26.0* 20.2*  MCV 83.9 84.5  --   --  83.7  --   --   PLT 172 120*  --   --  160  --   --    Cardiac Enzymes:  Recent Labs Lab 10/18/13 1653  TROPONINI <0.30   BNP (last 3 results) No results found for this basename: PROBNP,  in the last 8760 hours CBG:  Recent Labs Lab 10/20/13 1746 10/20/13 1956 10/21/13 0012 10/21/13 0406  10/21/13 0821  GLUCAP 89 107* 96 106* 96    Recent Results (from the past 240 hour(s))  MRSA PCR SCREENING     Status: None   Collection Time    10/18/13  7:43 PM      Result Value Ref Range Status   MRSA by PCR NEGATIVE  NEGATIVE Final   Comment:            The GeneXpert MRSA Assay (FDA     approved for NASAL specimens     only), is one component of a     comprehensive MRSA colonization     surveillance program. It is not     intended to diagnose MRSA     infection nor to guide or     monitor treatment for     MRSA infections.     Studies: No results found.  Scheduled Meds: . acetaminophen  650 mg Oral Once  . diphenhydrAMINE  25 mg Oral Once  . insulin aspart  0-9 Units Subcutaneous 6 times per day  . sodium chloride  500 mL Intravenous Once   Continuous Infusions: . sodium chloride 999 mL (10/21/13 1010)  . sodium chloride    . sodium chloride    . pantoprozole (PROTONIX) infusion 8 mg/hr (10/21/13 0331)       Time spent: 69mins    Dariusz Brase, MD  [facility]  If 7PM-7AM, please contact night-coverage at [email or web address], password [facility] 10/21/2013, 11:33 AM  LOS: 3 days

## 2013-10-22 DIAGNOSIS — E43 Unspecified severe protein-calorie malnutrition: Secondary | ICD-10-CM

## 2013-10-22 LAB — TYPE AND SCREEN
ABO/RH(D): O POS
ABO/RH(D): O POS
ANTIBODY SCREEN: NEGATIVE
ANTIBODY SCREEN: NEGATIVE
UNIT DIVISION: 0
UNIT DIVISION: 0
Unit division: 0
Unit division: 0
Unit division: 0
Unit division: 0

## 2013-10-22 LAB — HEMOGLOBIN AND HEMATOCRIT, BLOOD
HCT: 22.4 % — ABNORMAL LOW (ref 39.0–52.0)
HCT: 24 % — ABNORMAL LOW (ref 39.0–52.0)
HCT: 25 % — ABNORMAL LOW (ref 39.0–52.0)
HEMOGLOBIN: 8.8 g/dL — AB (ref 13.0–17.0)
Hemoglobin: 7.8 g/dL — ABNORMAL LOW (ref 13.0–17.0)
Hemoglobin: 8.3 g/dL — ABNORMAL LOW (ref 13.0–17.0)

## 2013-10-22 LAB — GLUCOSE, CAPILLARY
GLUCOSE-CAPILLARY: 88 mg/dL (ref 70–99)
Glucose-Capillary: 100 mg/dL — ABNORMAL HIGH (ref 70–99)
Glucose-Capillary: 106 mg/dL — ABNORMAL HIGH (ref 70–99)
Glucose-Capillary: 121 mg/dL — ABNORMAL HIGH (ref 70–99)
Glucose-Capillary: 136 mg/dL — ABNORMAL HIGH (ref 70–99)
Glucose-Capillary: 117 mg/dL — ABNORMAL HIGH (ref 70–99)

## 2013-10-22 MED ORDER — PANTOPRAZOLE SODIUM 40 MG PO TBEC
40.0000 mg | DELAYED_RELEASE_TABLET | Freq: Every day | ORAL | Status: DC
  Administered 2013-10-22 – 2013-10-23 (×2): 40 mg via ORAL
  Filled 2013-10-22 (×2): qty 1

## 2013-10-22 NOTE — Progress Notes (Signed)
Subjective  hungry   Objective  Post acute lower GI bleed,likely diverticular. Red blood cell pool scan was negative for active bleeding. Patient did not need an arteriogram. No stools since last night. Suspect diverticular bleed. Vital signs in last 24 hours: Temp:  [97.6 F (36.4 C)-98.4 F (36.9 C)] 97.9 F (36.6 C) (03/15 0758) Pulse Rate:  [87-106] 94 (03/15 0800) Resp:  [15-24] 16 (03/15 0800) BP: (113-158)/(64-79) 155/79 mmHg (03/15 0800) SpO2:  [98 %-100 %] 100 % (03/15 0800) Last BM Date: 10/21/13 General:    AA malein NAD, raspy voice Heart:  Regular rate and rhythm; no murmurs Lungs: Respirations even and unlabored, lungs CTA bilaterally Abdomen:  Soft, nontender and nondistended. Normal bowel sounds. Extremities:  Without edema. Neurologic:  Alert and oriented,  grossly normal neurologically. Psych:  Cooperative. Normal mood and affect.  Intake/Output from previous day: 03/14 0701 - 03/15 0700 In: 4522.5 [P.O.:360; I.V.:3200; Blood:962.5] Out: 1025 [Urine:1025] Intake/Output this shift: Total I/O In: 300 [I.V.:300] Out: 300 [Urine:300]  Lab Results:  Recent Labs  10/20/13 0236  10/21/13 0515 10/22/13 0145 10/22/13 1010  WBC 6.2  --   --   --   --   HGB 9.0*  < > 6.5* 7.8* 8.3*  HCT 26.2*  < > 19.0* 22.4* 24.0*  PLT 160  --   --   --   --   < > = values in this interval not displayed. BMET  Recent Labs  10/20/13 0236 10/21/13 0340  NA 135* 131*  K 4.3 4.0  CL 96 95*  CO2 27 24  GLUCOSE 92 108*  BUN 14 11  CREATININE 0.97 0.95  CALCIUM 9.1 8.1*   LFT No results found for this basename: PROT, ALBUMIN, AST, ALT, ALKPHOS, BILITOT, BILIDIR, IBILI,  in the last 72 hours PT/INR No results found for this basename: LABPROT, INR,  in the last 72 hours  Studies/Results: Nm Gi Blood Loss  10/22/2013   CLINICAL DATA:  Gastrointestinal bleeding.  EXAM: NUCLEAR MEDICINE GASTROINTESTINAL BLEEDING SCAN  TECHNIQUE: Sequential abdominal images were  obtained following intravenous administration of Tc-9m labeled red blood cells.  RADIOPHARMACEUTICALS:  25MILLI CURIE ULTRATAG TECHNETIUM TC 35M-LABELED RED BLOOD CELLS IV KITmCi Tc-17m in-vitro labeled red cells.  COMPARISON:  None.  FINDINGS: Normal intravascular, hepatic, splenic and bladder activity. Normal activity in the heart. No gastrointestinal activity is seen up two hours after injection.  IMPRESSION: No active gastrointestinal bleeding.   Electronically Signed   By: Enrique Sack M.D.   On: 10/22/2013 00:44   Dg Abd Portable 1v  10/21/2013   CLINICAL DATA:  Confirm nasogastric tube placement.  EXAM: PORTABLE ABDOMEN - 1 VIEW  COMPARISON:  None.  FINDINGS: An enteric tube reaches the proximal stomach. Advancement by 5 cm would provide more secure positioning. Nonspecific bowel gas pattern, without definitively dilated small bowel. Lung bases are clear.  IMPRESSION: Nasogastric tube enters the proximal stomach. Advancement by 5 cm would provide more secure positioning.   Electronically Signed   By: Jorje Guild M.D.   On: 10/21/2013 11:56       Assessment / Plan:   Patient had diverticular bleed. The bleeding has stopped. He is hemodynamically stable. We will plan to observe him today. Advance to full liquids and continue H. and H. every 8 hours, If he has repeat bleeding we will consider visceral arteriogram  Active Problems:   Throat cancer   Diabetes   Acute blood loss anemia   GI bleed  Protein-calorie malnutrition, severe   Stricture and stenosis of esophagus     LOS: 4 days   Delfin Edis  10/22/2013, 11:03 AM

## 2013-10-22 NOTE — Progress Notes (Signed)
[facility] PROGRESS NOTE  Joseph Petersen ZCH:[id number] DOB: 1946/05/26 DOA: 10/18/2013 PCP: No primary provider on file.  Assessment/Plan: 1. Acute GI bleeding.- diverticular- blood noted on colonoscopy- bleeding scan negative- advance to full liquids per GI 2. Esophageal ulceration- PPI 3.  Acute blood loss anemia. Status post transfusions of 4 U PRBCs-  Cont to follow h/h 4. Throat cancer. Patient is set up for surgical management at the [place] in the coming weeks. Patient also has an abnormal chest x-ray and with his history of cancer. 5. Hypotension. Likely related to GI bleeding. Resolved. 6. Diabetes. Continue to hold metformin. He is on sliding-scale insulin.  Code Status: full code Family Communication: discussed with patient  Disposition Plan: transfer to [facility] for further care.   Consultants:  Gastroenterology  Procedures:  3/13 EGD  3/ 14 colonoscopy and bleeding scan   HPI/Subjective: No further bloody BMs today  Objective: Filed Vitals:   10/22/13 1206  BP: 132/68  Pulse: 90  Temp: 98.1 F (36.7 C)  Resp: 18    Intake/Output Summary (Last 24 hours) at 10/22/13 1432 Last data filed at 10/22/13 1400  Gross per 24 hour  Intake   4265 ml  Output    875 ml  Net   3390 ml   Filed Weights   10/20/13 0400 10/21/13 0403 10/21/13 0800  Weight: 71.4 kg (157 lb 6.5 oz) 71.4 kg (157 lb 6.5 oz) 74.2 kg (163 lb 9.3 oz)    Exam:   General:  NAD, cachetic, hoarse voice  Cardiovascular: S1, S2 RRR  Respiratory: CTA B  Abdomen: soft, nt, nd, bs+  Musculoskeletal: no edema b/l   Data Reviewed: Basic Metabolic Panel:  Recent Labs Lab 10/18/13 1653 10/19/13 0710 10/20/13 0236 10/21/13 0340  NA 133* 136* 135* 131*  K 4.8 5.0 4.3 4.0  CL 96 99 96 95*  CO2 29 30 27 24   GLUCOSE 205* 85 92 108*  BUN 18 13 14 11   CREATININE 1.08 1.06 0.97 0.95  CALCIUM 8.2* 8.8 9.1 8.1*   Liver Function Tests:  Recent Labs Lab 10/19/13 0710   AST 13  ALT 13  ALKPHOS 35*  BILITOT 1.3*  PROT 5.6*  ALBUMIN 2.7*   No results found for this basename: LIPASE, AMYLASE,  in the last 168 hours No results found for this basename: AMMONIA,  in the last 168 hours CBC:  Recent Labs Lab 10/18/13 1653 10/19/13 0710  10/20/13 0236 10/20/13 1707 10/21/13 0340 10/21/13 0515 10/22/13 0145 10/22/13 1010  WBC 7.1 4.0  --  6.2  --   --   --   --   --   NEUTROABS 6.4  --   --   --   --   --   --   --   --   HGB 6.2* 7.8*  < > 9.0* 8.9* 7.0* 6.5* 7.8* 8.3*  HCT 18.7* 22.4*  < > 26.2* 26.0* 20.2* 19.0* 22.4* 24.0*  MCV 83.9 84.5  --  83.7  --   --   --   --   --   PLT 172 120*  --  160  --   --   --   --   --   < > = values in this interval not displayed. Cardiac Enzymes:  Recent Labs Lab 10/18/13 1653  TROPONINI <0.30   BNP (last 3 results) No results found for this basename: PROBNP,  in the last 8760 hours CBG:  Recent Labs Lab 10/21/13  1952 10/22/13 0049 10/22/13 0433 10/22/13 0832 10/22/13 1208  GLUCAP 122* 88 100* 106* 121*    Recent Results (from the past 240 hour(s))  MRSA PCR SCREENING     Status: None   Collection Time    10/18/13  7:43 PM      Result Value Ref Range Status   MRSA by PCR NEGATIVE  NEGATIVE Final   Comment:            The GeneXpert MRSA Assay (FDA     approved for NASAL specimens     only), is one component of a     comprehensive MRSA colonization     surveillance program. It is not     intended to diagnose MRSA     infection nor to guide or     monitor treatment for     MRSA infections.     Studies: Nm Gi Blood Loss  10/22/2013   CLINICAL DATA:  Gastrointestinal bleeding.  EXAM: NUCLEAR MEDICINE GASTROINTESTINAL BLEEDING SCAN  TECHNIQUE: Sequential abdominal images were obtained following intravenous administration of Tc-43m labeled red blood cells.  RADIOPHARMACEUTICALS:  25MILLI CURIE ULTRATAG TECHNETIUM TC 16M-LABELED RED BLOOD CELLS IV KITmCi Tc-31m in-vitro labeled red cells.   COMPARISON:  None.  FINDINGS: Normal intravascular, hepatic, splenic and bladder activity. Normal activity in the heart. No gastrointestinal activity is seen up two hours after injection.  IMPRESSION: No active gastrointestinal bleeding.   Electronically Signed   By: Enrique Sack M.D.   On: 10/22/2013 00:44   Dg Abd Portable 1v  10/21/2013   CLINICAL DATA:  Confirm nasogastric tube placement.  EXAM: PORTABLE ABDOMEN - 1 VIEW  COMPARISON:  None.  FINDINGS: An enteric tube reaches the proximal stomach. Advancement by 5 cm would provide more secure positioning. Nonspecific bowel gas pattern, without definitively dilated small bowel. Lung bases are clear.  IMPRESSION: Nasogastric tube enters the proximal stomach. Advancement by 5 cm would provide more secure positioning.   Electronically Signed   By: Jorje Guild M.D.   On: 10/21/2013 11:56    Scheduled Meds: . acetaminophen  650 mg Oral Once  . diphenhydrAMINE  25 mg Oral Once  . insulin aspart  0-9 Units Subcutaneous 6 times per day  . pantoprazole  40 mg Oral Q0600  . sodium chloride  500 mL Intravenous Once   Continuous Infusions: . sodium chloride 1,000 mL (10/22/13 0053)       Time spent: 6mins    Lien Lyman, MD  [facility]  If 7PM-7AM, please contact night-coverage at [email or web address], password [facility] 10/22/2013, 2:32 PM  LOS: 4 days

## 2013-10-23 ENCOUNTER — Encounter (HOSPITAL_COMMUNITY): Payer: Self-pay | Admitting: Internal Medicine

## 2013-10-23 DIAGNOSIS — K5730 Diverticulosis of large intestine without perforation or abscess without bleeding: Secondary | ICD-10-CM

## 2013-10-23 LAB — HEMOGLOBIN AND HEMATOCRIT, BLOOD
HEMATOCRIT: 22.9 % — AB (ref 39.0–52.0)
HEMATOCRIT: 23.8 % — AB (ref 39.0–52.0)
HEMOGLOBIN: 8 g/dL — AB (ref 13.0–17.0)
Hemoglobin: 8.3 g/dL — ABNORMAL LOW (ref 13.0–17.0)

## 2013-10-23 LAB — GLUCOSE, CAPILLARY
Glucose-Capillary: 103 mg/dL — ABNORMAL HIGH (ref 70–99)
Glucose-Capillary: 109 mg/dL — ABNORMAL HIGH (ref 70–99)
Glucose-Capillary: 118 mg/dL — ABNORMAL HIGH (ref 70–99)

## 2013-10-23 NOTE — Progress Notes (Signed)
Daily Rounding Note  10/23/2013, 8:41 AM  LOS: 5 days   SUBJECTIVE:       No hematochezia.  No stools at all. Lat blood was 3/14 at 2000. Lots of coughing. Dysphagia (at back of throat) continues.  Not improved by Dilation of GEJ stricture.  [facility]aryngectomy at [facility] was/is set up for 3/17.  [facility] not yet notified of pt's current status.   OBJECTIVE:         Vital signs in last 24 hours:    Temp:  [98 F (36.7 C)-98.5 F (36.9 C)] 98.5 F (36.9 C) (03/16 0404) Pulse Rate:  [90-101] 91 (03/16 0809) Resp:  [14-19] 19 (03/16 0809) BP: (132-157)/(68-95) 135/71 mmHg (03/16 0809) SpO2:  [98 %-100 %] 99 % (03/16 0809) Weight:  [75.1 kg (165 lb 9.1 oz)] 75.1 kg (165 lb 9.1 oz) (03/16 0404) Last BM Date: 10/22/13 General: looks cachectic, ill,    Heart: RRR Chest: coughing, clearing thick secretions Abdomen: soft, thin, NT, ND  Extremities: no CCE.  Thin legs and arms Neuro/Psych:  Cooperative, relaxed.   Intake/Output from previous day: 03/15 0701 - 03/16 0700 In: 3180 [P.O.:780; I.V.:2400] Out: 2450 [Urine:2450]  Intake/Output this shift: Total I/O In: 100 [I.V.:100] Out: 500 [Urine:500]  Lab Results:  Recent Labs  10/22/13 1010 10/22/13 1615 10/23/13 0110  HGB 8.3* 8.8* 8.0*  HCT 24.0* 25.0* 22.9*   BMET  Recent Labs  10/21/13 0340  NA 131*  K 4.0  CL 95*  CO2 24  GLUCOSE 108*  BUN 11  CREATININE 0.95  CALCIUM 8.1*   LFT No results found for this basename: PROT, ALBUMIN, AST, ALT, ALKPHOS, BILITOT, BILIDIR, IBILI,  in the last 72 hours PT/INR No results found for this basename: LABPROT, INR,  in the last 72 hours Hepatitis Panel No results found for this basename: HEPBSAG, HCVAB, HEPAIGM, HEPBIGM,  in the last 72 hours  Studies/Results: Nm Gi Blood Loss  10/22/2013   CLINICAL DATA:  Gastrointestinal bleeding.  EXAM: NUCLEAR MEDICINE GASTROINTESTINAL BLEEDING SCAN  TECHNIQUE: Sequential abdominal  images were obtained following intravenous administration of Tc-94m labeled red blood cells.  RADIOPHARMACEUTICALS:  25MILLI CURIE ULTRATAG TECHNETIUM TC 87M-LABELED RED BLOOD CELLS IV KITmCi Tc-73m in-vitro labeled red cells.  COMPARISON:  None.  FINDINGS: Normal intravascular, hepatic, splenic and bladder activity. Normal activity in the heart. No gastrointestinal activity is seen up two hours after injection.  IMPRESSION: No active gastrointestinal bleeding.   Electronically Signed   By: Enrique Sack M.D.   On: 10/22/2013 00:44   Dg Abd Portable 1v  10/21/2013   CLINICAL DATA:  Confirm nasogastric tube placement.  EXAM: PORTABLE ABDOMEN - 1 VIEW  COMPARISON:  None.  FINDINGS: An enteric tube reaches the proximal stomach. Advancement by 5 cm would provide more secure positioning. Nonspecific bowel gas pattern, without definitively dilated small bowel. Lung bases are clear.  IMPRESSION: Nasogastric tube enters the proximal stomach. Advancement by 5 cm would provide more secure positioning.   Electronically Signed   By: Jorje Guild M.D.   On: 10/21/2013 11:56    ASSESMENT:   *  GIB EGD #1 3/6 (Dr Oneida Alar): clean based DU, no bleeding, GEJ stricture. Path c/w severe H Pylori gastritis.  Had inconsistent 2 day therapy for H   EGD #2 3/13 Olevia Perches). Pharyngeal malignancy. Distal esophageal stricture (dilated).  Benign, non-bleeding, healing DU  Colonoscopy 3/14: Moderated tics thruout colon.  RBC scan 3/15 for recurrent bleed.  Was negative but completed several hours post the recurrent bleeding 3/14 at 2000.  *  ABL anemia.  S/p PRBCs during initial GIB 3/6 in [place].  S/p 5 PRBCs since admission to [facility].  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  Laryngectomy planned later this month at [place] in [place]. This is causing a lot of coughing, dysphagia, concern for Asp PNA. O2 sats 99 to 100% however.   *  Left pleural density.  Rule out mets.  *  DM2.    PLAN   *   Dysphagia, chopped, diet. *  ? Transfer to [facility] today for surgery tomorrow?   *  May need CXR (last done 3/11) given reduced BS, dysphagia, cough and risk of aspiration. *  CBC in AM.   Joseph Petersen  10/23/2013, 8:41 AM Pager: [phone]

## 2013-10-23 NOTE — Discharge Summary (Signed)
Physician Discharge Summary  Joseph Petersen [id number] DOB: [birth year] DOA: 10/18/2013  PCP: No primary provider on file.  Admit date: 10/18/2013 Discharge date: 10/23/2013  Time spent: >45 minutes  Recommendations for Outpatient Follow-up:  1. F/u H/H and anemia panel in 1-2 wks  Discharge Diagnoses:  Principal Problem:   GI bleed Active Problems:   Throat cancer   Diabetes   Acute blood loss anemia   Protein-calorie malnutrition, severe   Stricture and stenosis of esophagus   Discharge Condition: stable  Diet recommendation: heart healthy, chopped  Filed Weights   10/21/13 0403 10/21/13 0800 10/23/13 0404  Weight: 71.4 kg (157 lb 6.5 oz) 74.2 kg (163 lb 9.3 oz) 75.1 kg (165 lb 9.1 oz)    History of present illness:  This 68 year old man was recently discharged having been admitted with upper GI bleed secondary to be bleeding duodenal ulcer. His hemoglobin on that admission was 5.7 and he was transfused 2 units of blood. His discharge hemoglobin was 8.2. He was discharged from the hospital 4 days ago. Today he became lightheaded and dizzy and also had further rectal bleeding once again. He has no hematemesis. There is no abdominal pain. He is a man who also has a history of throat cancer. This was diagnosed in 2013 and 4 which she has had local radiotherapy and lymph node dissection. He is apparently due to have further surgery on his throat soon in the [place] system. He describes a weight loss of 15 pounds in the last couple of months.   Hospital Course:  1. Acute GI bleeding.- EGD revealed ulcerated esophagus and clean based duodenal ulcer. Bleeding likely diverticular as there was blood in colon noted on colonoscopy- bleeding scan negative- advance to regular (chopped) diet per GI- no further bleeding noted 2. Esophageal ulceration- PPI 3. Acute blood loss anemia. Status post transfusions of 5 U PRBCs-  H/h at 8 for past couple of days-  4. Throat cancer. Patient is set up  for surgical management at the [facility] for tomorrow.  5. Hypotension. Likely related to GI bleeding. Resolved. 6. Diabetes. Continue metformin upon d/c.    Procedures: 3/13 EGD  3/ 14 colonoscopy and bleeding scan  Consultations: GI  Discharge Exam: Filed Vitals:   10/23/13 0914  BP: 149/100  Pulse:   Temp: 97.3 F (36.3 C)  Resp:     General: AAOx 3, no distress, frequent clearing of throat Cardiovascular: RRR, no murmurs Respiratory: CTA b/l  Abdomen: soft, NT, ND, BS+  Discharge Instructions      Discharge Orders   Future Orders Complete By Expires   Diet - low sodium heart healthy  As directed    Scheduling Instructions:     diabetic   Increase activity slowly  As directed        Medication List    STOP taking these medications       amoxicillin 500 MG capsule  Commonly known as:  AMOXIL     clarithromycin 500 MG tablet  Commonly known as:  BIAXIN      TAKE these medications       metFORMIN 500 MG tablet  Commonly known as:  GLUCOPHAGE  Take 500 mg by mouth daily.     pantoprazole 40 MG tablet  Commonly known as:  PROTONIX  Take 1 tablet (40 mg total) by mouth 2 (two) times daily before a meal.     simvastatin 40 MG tablet  Commonly known as:  ZOCOR  Take 40 mg  by mouth daily.       No Known Allergies    The results of significant diagnostics from this hospitalization (including imaging, microbiology, ancillary and laboratory) are listed below for reference.    Significant Diagnostic Studies: Nm Gi Blood Loss  10/22/2013   CLINICAL DATA:  Gastrointestinal bleeding.  EXAM: NUCLEAR MEDICINE GASTROINTESTINAL BLEEDING SCAN  TECHNIQUE: Sequential abdominal images were obtained following intravenous administration of Tc-33m labeled red blood cells.  RADIOPHARMACEUTICALS:  25MILLI CURIE ULTRATAG TECHNETIUM TC 28M-LABELED RED BLOOD CELLS IV KITmCi Tc-16m in-vitro labeled red cells.  COMPARISON:  None.  FINDINGS: Normal intravascular, hepatic, splenic  and bladder activity. Normal activity in the heart. No gastrointestinal activity is seen up two hours after injection.  IMPRESSION: No active gastrointestinal bleeding.   Electronically Signed   By: Enrique Sack M.D.   On: 10/22/2013 00:44   Dg Chest Portable 1 View  10/18/2013   CLINICAL DATA Shortness of breath.  History throat cancer.  EXAM PORTABLE CHEST - 1 VIEW  COMPARISON None.  FINDINGS Mild prominence of the mediastinum is noted. This may be from prominent great vessels however mass lesion or adenopathy cannot be excluded. Lungs are clear of acute infiltrates. Left apical pleural thickening is noted. Pleural based left apical pleural or pulmonary mass cannot be excluded. Adjacent surgical clips noted over the left apex. No pleural effusion or pneumothorax. No focal osseous abnormality. Heart size and pulmonary vascularity normal.  IMPRESSION 1. Left apical pleural-based density. CT of the chest suggested for further evaluation. 2. Mild prominence of the superior mediastinum, most likely vascular, superior mediastinal mass cannot be completely excluded and CT would be useful for further evaluation of this region as well. 3. No acute cardiopulmonary disease.  No focal pulmonary infiltrate.  SIGNATURE  Electronically Signed   By: Marcello Moores  Register   On: 10/18/2013 17:37   Dg Abd Portable 1v  10/21/2013   CLINICAL DATA:  Confirm nasogastric tube placement.  EXAM: PORTABLE ABDOMEN - 1 VIEW  COMPARISON:  None.  FINDINGS: An enteric tube reaches the proximal stomach. Advancement by 5 cm would provide more secure positioning. Nonspecific bowel gas pattern, without definitively dilated small bowel. Lung bases are clear.  IMPRESSION: Nasogastric tube enters the proximal stomach. Advancement by 5 cm would provide more secure positioning.   Electronically Signed   By: Jorje Guild M.D.   On: 10/21/2013 11:56    Microbiology: Recent Results (from the past 240 hour(s))  MRSA PCR SCREENING     Status: None    Collection Time    10/18/13  7:43 PM      Result Value Ref Range Status   MRSA by PCR NEGATIVE  NEGATIVE Final   Comment:            The GeneXpert MRSA Assay (FDA     approved for NASAL specimens     only), is one component of a     comprehensive MRSA colonization     surveillance program. It is not     intended to diagnose MRSA     infection nor to guide or     monitor treatment for     MRSA infections.     Labs: Basic Metabolic Panel:  Recent Labs Lab 10/18/13 1653 10/19/13 0710 10/20/13 0236 10/21/13 0340  NA 133* 136* 135* 131*  K 4.8 5.0 4.3 4.0  CL 96 99 96 95*  CO2 29 30 27 24   GLUCOSE 205* 85 92 108*  BUN 18 13 14  11  CREATININE 1.08 1.06 0.97 0.95  CALCIUM 8.2* 8.8 9.1 8.1*   Liver Function Tests:  Recent Labs Lab 10/19/13 0710  AST 13  ALT 13  ALKPHOS 35*  BILITOT 1.3*  PROT 5.6*  ALBUMIN 2.7*   No results found for this basename: LIPASE, AMYLASE,  in the last 168 hours No results found for this basename: AMMONIA,  in the last 168 hours CBC:  Recent Labs Lab 10/18/13 1653 10/19/13 0710  10/20/13 0236  10/22/13 0145 10/22/13 1010 10/22/13 1615 10/23/13 0110 10/23/13 0822  WBC 7.1 4.0  --  6.2  --   --   --   --   --   --   NEUTROABS 6.4  --   --   --   --   --   --   --   --   --   HGB 6.2* 7.8*  < > 9.0*  < > 7.8* 8.3* 8.8* 8.0* 8.3*  HCT 18.7* 22.4*  < > 26.2*  < > 22.4* 24.0* 25.0* 22.9* 23.8*  MCV 83.9 84.5  --  83.7  --   --   --   --   --   --   PLT 172 120*  --  160  --   --   --   --   --   --   < > = values in this interval not displayed. Cardiac Enzymes:  Recent Labs Lab 10/18/13 1653  TROPONINI <0.30   BNP: BNP (last 3 results) No results found for this basename: PROBNP,  in the last 8760 hours CBG:  Recent Labs Lab 10/22/13 1606 10/22/13 2007 10/23/13 0008 10/23/13 0405 10/23/13 0811  GLUCAP 136* 117* 103* 109* 118*       SignedDebbe Odea, MD [facility] 10/23/2013, 1:38 PM

## 2013-10-23 NOTE — Progress Notes (Signed)
Patient seen, examined, and I agree with the above documentation, including the assessment and plan. Pt being transferred to [facility] medical center No evidence for rebleeding at this time.

## 2013-12-11 ENCOUNTER — Other Ambulatory Visit: Payer: Self-pay | Admitting: Gastroenterology

## 2014-01-21 ENCOUNTER — Emergency Department (HOSPITAL_COMMUNITY)
Admission: EM | Admit: 2014-01-21 | Discharge: 2014-01-21 | Disposition: A | Discharge status: Home / Self Care | Payer: Medicare HMO | Attending: Emergency Medicine | Admitting: Emergency Medicine

## 2014-01-21 ENCOUNTER — Emergency Department (HOSPITAL_COMMUNITY): Payer: Medicare HMO

## 2014-01-21 ENCOUNTER — Encounter (HOSPITAL_COMMUNITY): Payer: Self-pay | Admitting: Emergency Medicine

## 2014-01-21 DIAGNOSIS — Z862 Personal history of diseases of the blood and blood-forming organs and certain disorders involving the immune mechanism: Secondary | ICD-10-CM | POA: Insufficient documentation

## 2014-01-21 DIAGNOSIS — Z8639 Personal history of other endocrine, nutritional and metabolic disease: Secondary | ICD-10-CM | POA: Insufficient documentation

## 2014-01-21 DIAGNOSIS — K9423 Gastrostomy malfunction: Secondary | ICD-10-CM | POA: Insufficient documentation

## 2014-01-21 DIAGNOSIS — C140 Malignant neoplasm of pharynx, unspecified: Secondary | ICD-10-CM

## 2014-01-21 DIAGNOSIS — I10 Essential (primary) hypertension: Secondary | ICD-10-CM | POA: Insufficient documentation

## 2014-01-21 DIAGNOSIS — C490 Malignant neoplasm of connective and soft tissue of head, face and neck: Secondary | ICD-10-CM | POA: Insufficient documentation

## 2014-01-21 DIAGNOSIS — T85598A Other mechanical complication of other gastrointestinal prosthetic devices, implants and grafts, initial encounter: Secondary | ICD-10-CM

## 2014-01-21 DIAGNOSIS — E119 Type 2 diabetes mellitus without complications: Secondary | ICD-10-CM | POA: Insufficient documentation

## 2014-01-21 DIAGNOSIS — Z9889 Other specified postprocedural states: Secondary | ICD-10-CM | POA: Insufficient documentation

## 2014-01-21 DIAGNOSIS — Z79899 Other long term (current) drug therapy: Secondary | ICD-10-CM | POA: Insufficient documentation

## 2014-01-21 DIAGNOSIS — Z87891 Personal history of nicotine dependence: Secondary | ICD-10-CM | POA: Insufficient documentation

## 2014-01-21 DIAGNOSIS — C7800 Secondary malignant neoplasm of unspecified lung: Secondary | ICD-10-CM | POA: Insufficient documentation

## 2014-01-21 HISTORY — DX: Secondary malignant neoplasm of unspecified lung: C78.00

## 2014-01-21 LAB — CBC WITH DIFFERENTIAL/PLATELET
BASOS ABS: 0 10*3/uL (ref 0.0–0.1)
BASOS PCT: 0 % (ref 0–1)
EOS ABS: 0 10*3/uL (ref 0.0–0.7)
Eosinophils Relative: 0 % (ref 0–5)
HCT: 26.3 % — ABNORMAL LOW (ref 39.0–52.0)
HEMOGLOBIN: 8.5 g/dL — AB (ref 13.0–17.0)
Lymphocytes Relative: 3 % — ABNORMAL LOW (ref 12–46)
Lymphs Abs: 0.2 10*3/uL — ABNORMAL LOW (ref 0.7–4.0)
MCH: 23.5 pg — AB (ref 26.0–34.0)
MCHC: 32.3 g/dL (ref 30.0–36.0)
MCV: 72.9 fL — ABNORMAL LOW (ref 78.0–100.0)
Monocytes Absolute: 0.3 10*3/uL (ref 0.1–1.0)
Monocytes Relative: 4 % (ref 3–12)
NEUTROS ABS: 5.6 10*3/uL (ref 1.7–7.7)
NEUTROS PCT: 93 % — AB (ref 43–77)
Platelets: 158 10*3/uL (ref 150–400)
RBC: 3.61 MIL/uL — ABNORMAL LOW (ref 4.22–5.81)
RDW: 17.4 % — AB (ref 11.5–15.5)
WBC: 6.1 10*3/uL (ref 4.0–10.5)

## 2014-01-21 LAB — BASIC METABOLIC PANEL
BUN: 40 mg/dL — ABNORMAL HIGH (ref 6–23)
CO2: 33 mEq/L — ABNORMAL HIGH (ref 19–32)
Calcium: 11.1 mg/dL — ABNORMAL HIGH (ref 8.4–10.5)
Chloride: 91 mEq/L — ABNORMAL LOW (ref 96–112)
Creatinine, Ser: 1.16 mg/dL (ref 0.50–1.35)
GFR, EST AFRICAN AMERICAN: 73 mL/min — AB (ref >90)
GFR, EST NON AFRICAN AMERICAN: 63 mL/min — AB (ref >90)
Glucose, Bld: 156 mg/dL — ABNORMAL HIGH (ref 70–99)
POTASSIUM: 4.4 meq/L (ref 3.7–5.3)
Sodium: 137 mEq/L (ref 137–147)

## 2014-01-21 MED ORDER — HYDROGEN PEROXIDE 3 % EX SOLN
CUTANEOUS | Status: AC
  Filled 2014-01-21: qty 473

## 2014-01-21 MED ORDER — IOHEXOL 300 MG/ML  SOLN
50.0000 mL | Freq: Once | INTRAMUSCULAR | Status: AC | PRN
  Administered 2014-01-21: 50 mL

## 2014-01-21 NOTE — ED Notes (Signed)
Patient told to come directly here by hospice nurse. Patient's feeding tube became dislodged this morning at 6. Per family arear bleeding and "fluids leaking from site."

## 2014-01-21 NOTE — Progress Notes (Signed)
I was called to suction the patient and clear out his trach, upon arriving in the room I notice the patient had a copious amounts of secretions. I suctioned out the patient and cleaned around the trach and collar. I replaced the collar and attempted to clean out the trach opening. I cleaned the area with a peroxide and water solution to remove the dead skin and dirt. I applied a gauze pad around the trach to capture the secretions oozing from around the trach. Patient was on room air saturations of 98%.

## 2014-01-21 NOTE — ED Notes (Signed)
Patient with no complaints at this time. Respirations even and unlabored. Skin warm/dry. Discharge instructions reviewed with patient at this time. Patient given opportunity to voice concerns/ask questions. Patient discharged at this time and left Emergency Department with steady gait.   

## 2014-01-21 NOTE — ED Provider Notes (Addendum)
CSN: [id number]     Arrival date & time 01/21/14  1830 History   First MD Initiated Contact with Patient 01/21/14 1844     Chief Complaint  Patient presents with  . feeding tube dislodged      (Consider location/radiation/quality/duration/timing/severity/associated sxs/prior Treatment) HPI.....  Patient is under the care of hospice for "throat cancer".  He has received his medical care at the [facility] system.  Allegedly, today his feeding tube became "dislodged" at 6 AM. It apparently is now back in place. There was some "fluid leaking from the ostomy site".  Patient also has a tracheostomy. Complains of skin irritation in lower abdomen. He is ambulatory without fever. Severity is mild. Nothing makes symptoms better or worse  Past Medical History  Diagnosis Date  . Hypertension   . Diabetes 10/12/2013  . Throat cancer 10/12/2013  . Hyperlipidemia 10/12/2013  . Metastasis to lung    Past Surgical History  Procedure Laterality Date  . Throat surgery  2013    remove cancer, [facility] [place]  . Colonoscopy      [facility] around 2007? polyps  . Esophagogastroduodenoscopy N/A 10/13/2013    Procedure: ESOPHAGOGASTRODUODENOSCOPY (EGD);  Surgeon: Danie Binder, MD;  Location[facility];  Service: Endoscopy;  Laterality: N/A;  . Colonoscopy N/A 10/21/2013    Procedure: COLONOSCOPY;  Surgeon: Lafayette Dragon, MD;  Location: [place];  Service: Endoscopy;  Laterality: N/A;  . Esophagogastroduodenoscopy N/A 10/20/2013    Procedure: ESOPHAGOGASTRODUODENOSCOPY (EGD);  Surgeon: Lafayette Dragon, MD;  Location: [facility] ENDOSCOPY;  Service: Endoscopy;  Laterality: N/A;   Family History  Problem Relation Age of Onset  . Colon cancer Neg Hx    History  Substance Use Topics  . Smoking status: Former Smoker -- 2.00 packs/day for 30 years    Types: Cigarettes  . Smokeless tobacco: Never Used  . Alcohol Use: No    Review of Systems  All other systems reviewed and are negative.     Allergies  Review of patient's  allergies indicates no known allergies.  Home Medications   Prior to Admission medications   Medication Sig Start Date End Date Taking? Authorizing Provider  pantoprazole (PROTONIX) 40 MG tablet Place 40 mg into feeding tube 2 (two) times daily.    Yes Historical Provider, MD   BP 143/97  Pulse 115  Temp(Src) 97.8 F (36.6 C) (Oral)  Resp 20  Wt 142 lb 8 oz (64.638 kg)  SpO2 95% Physical Exam  Nursing note and vitals reviewed. Constitutional: He is oriented to person, place, and time.  Cachectic  HENT:  Head: Normocephalic and atraumatic.  Eyes: Conjunctivae and EOM are normal. Pupils are equal, round, and reactive to light.  Neck:  Tracheostomy in place  Cardiovascular: Normal rate, regular rhythm and normal heart sounds.   Pulmonary/Chest: Effort normal and breath sounds normal.  Abdominal: Soft. Bowel sounds are normal.  Feeding tube appears to be in place.  Ostomy site inferiorly is erythematous and irritated.  No evidence of cellulitis. No blood from ostomy site  Musculoskeletal: Normal range of motion.  Neurological: He is alert and oriented to person, place, and time.  Skin: Skin is warm and dry.  Psychiatric: He has a normal mood and affect. His behavior is normal.    ED Course  Procedures (including critical care time) Labs Review Labs Reviewed  BASIC METABOLIC PANEL - Abnormal; Notable for the following:    Chloride 91 (*)    CO2 33 (*)  Glucose, Bld 156 (*)    BUN 40 (*)    Calcium 11.1 (*)    GFR calc non Af Amer 63 (*)    GFR calc Af Amer 73 (*)    All other components within normal limits  CBC WITH DIFFERENTIAL - Abnormal; Notable for the following:    RBC 3.61 (*)    Hemoglobin 8.5 (*)    HCT 26.3 (*)    MCV 72.9 (*)    MCH 23.5 (*)    RDW 17.4 (*)    Neutrophils Relative % 93 (*)    Lymphocytes Relative 3 (*)    Lymphs Abs 0.2 (*)    All other components within normal limits    Imaging Review No results found.   EKG  Interpretation None      MDM   Final diagnoses:  Throat cancer  Feeding tube dysfunction        Patient is ambulatory and in no acute distress.   X-rays [with contrast injection] reveal normal positioning of gastrostomy tube.  Tracheostomy tube was suctioned in the emergency department.   Lower abdomen was irritated but not cellulitic. I saw no blood leaking from the ostomy site. Family requested a consultation to the [place] system in [place] where the patient gets his care. I attempted to do this, but received no return phone call from them.      I discussed all the clinical findings with the family and the patient just prior to discharge. They appeared to be upset with me in regards to my decision to discharge the patient. I did not feel he met any criteria for admission to the hospital. I made a effort to contact the [place] system in [place].   At no time was I disrespectful to the family or  descending. I attempted to answer their questions in an honest and straight forward manner.   His tracheostomy was functioning and his gastrostomy tube was in place.            Nat Christen, MD 01/27/14 [place], MD 01/27/14 [phone]

## 2014-01-21 NOTE — Discharge Instructions (Signed)
Recommend Neosporin ointment to abdominal skin.   Suggest call to [facility] Monday to see the gastroenterologist.   We did an x-ray tonight and the feeding tube is in the appropriate place.

## 2014-02-14 ENCOUNTER — Encounter: Payer: Self-pay | Admitting: Gastroenterology

## 2014-03-10 DEATH — deceased

## 2014-04-10 DEATH — deceased

## 2015-08-21 IMAGING — CR DG ABD PORTABLE 1V
1 series · 1 of 1 positions shown · non-contrast
Comparison: None.

CLINICAL DATA: Confirm nasogastric tube placement.

EXAM:
PORTABLE ABDOMEN - 1 VIEW

[AP]
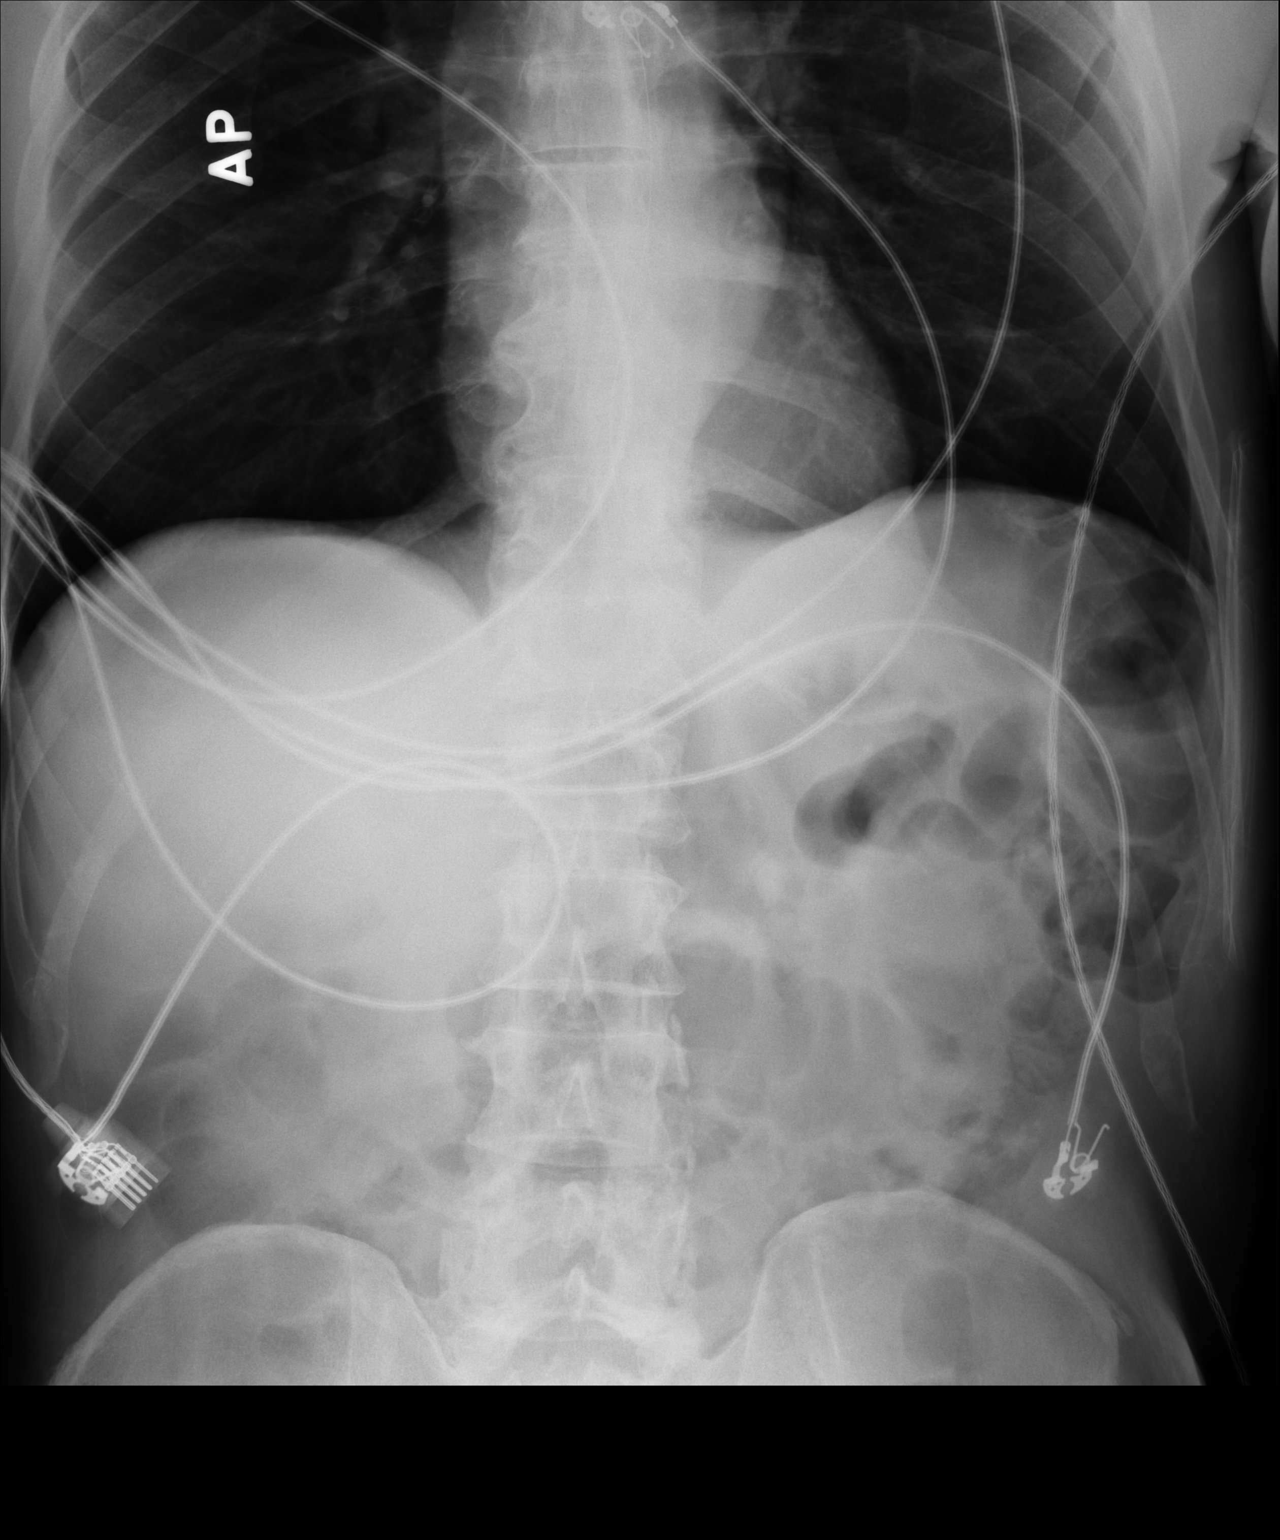

[1 of 1 positions shown; findings below may reference images not displayed]

FINDINGS: An enteric tube reaches the proximal stomach. Advancement by 5 cm
would provide more secure positioning. Nonspecific bowel gas
pattern, without definitively dilated small bowel. Lung bases are
clear.
IMPRESSION: Nasogastric tube enters the proximal stomach. Advancement by 5 cm
would provide more secure positioning.
